# Patient Record
Sex: Female | Born: 1985 | Race: Black or African American | Hispanic: No | Marital: Married | Smoking: Former smoker
Health system: Southern US, Community
[De-identification: ages and names within clinical notes are randomized; demographics above are authoritative.]

## PROBLEM LIST (undated history)

## (undated) ENCOUNTER — Inpatient Hospital Stay (HOSPITAL_COMMUNITY): Payer: Self-pay

## (undated) DIAGNOSIS — Z789 Other specified health status: Secondary | ICD-10-CM

## (undated) HISTORY — PX: CERVICAL POLYPECTOMY: SHX88

## (undated) HISTORY — PX: DILATION AND CURETTAGE OF UTERUS: SHX78

---
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
## 2005-10-26 ENCOUNTER — Other Ambulatory Visit: Admission: RE | Admit: 2005-10-26 | Discharge: 2005-10-26 | Payer: Self-pay | Admitting: *Deleted

## 2006-12-30 ENCOUNTER — Other Ambulatory Visit: Admission: RE | Admit: 2006-12-30 | Discharge: 2006-12-30 | Payer: Self-pay | Admitting: *Deleted

## 2009-01-24 ENCOUNTER — Ambulatory Visit (HOSPITAL_COMMUNITY): Admission: RE | Admit: 2009-01-24 | Discharge: 2009-01-24 | Payer: Self-pay | Admitting: Obstetrics and Gynecology

## 2009-01-24 ENCOUNTER — Encounter (INDEPENDENT_AMBULATORY_CARE_PROVIDER_SITE_OTHER): Payer: Self-pay | Admitting: Obstetrics and Gynecology

## 2009-01-30 ENCOUNTER — Ambulatory Visit (HOSPITAL_COMMUNITY): Admission: AD | Admit: 2009-01-30 | Discharge: 2009-01-30 | Payer: Self-pay | Admitting: Obstetrics and Gynecology

## 2009-01-30 ENCOUNTER — Encounter (INDEPENDENT_AMBULATORY_CARE_PROVIDER_SITE_OTHER): Payer: Self-pay | Admitting: Obstetrics and Gynecology

## 2010-07-17 LAB — RH IMMUNE GLOBULIN WORKUP (NOT WOMEN'S HOSP)
ABO/RH(D): O NEG
Antibody Screen: NEGATIVE

## 2010-07-17 LAB — CBC
MCHC: 33.7 g/dL (ref 30.0–36.0)
MCHC: 34.1 g/dL (ref 30.0–36.0)
MCV: 92.3 fL (ref 78.0–100.0)
Platelets: 185 10*3/uL (ref 150–400)
RBC: 3.89 MIL/uL (ref 3.87–5.11)
RDW: 13.3 % (ref 11.5–15.5)
WBC: 4.6 10*3/uL (ref 4.0–10.5)

## 2011-10-21 LAB — OB RESULTS CONSOLE GC/CHLAMYDIA
Chlamydia: NEGATIVE
Gonorrhea: NEGATIVE

## 2011-10-21 LAB — OB RESULTS CONSOLE RUBELLA ANTIBODY, IGM: Rubella: IMMUNE

## 2011-10-21 LAB — OB RESULTS CONSOLE HEPATITIS B SURFACE ANTIGEN: Hepatitis B Surface Ag: NEGATIVE

## 2011-10-21 LAB — OB RESULTS CONSOLE HIV ANTIBODY (ROUTINE TESTING): HIV: NONREACTIVE

## 2011-10-21 LAB — OB RESULTS CONSOLE RPR: RPR: NONREACTIVE

## 2011-11-17 ENCOUNTER — Other Ambulatory Visit: Payer: Self-pay | Admitting: Obstetrics and Gynecology

## 2012-04-13 NOTE — L&D Delivery Note (Signed)
Delivery Note  SVD viable female Apgars 9,9 over vaginal sulcus tear but intact perineum. Nuchal cord x 1 reduced. Placenta delivered spontaneously intact with 3VC. Repair with 2-0 Chromic with good support and hemostasis noted and R/V exam confirms.  PH art was sent.  [place] cord blood was not done.  Mother and baby were doing well.  EBL 300cc  Candice Camp, MD

## 2012-04-30 ENCOUNTER — Inpatient Hospital Stay (HOSPITAL_COMMUNITY)
Admission: AD | Admit: 2012-04-30 | Discharge: 2012-04-30 | Disposition: A | Discharge status: Home / Self Care | Payer: BC Managed Care – PPO | Source: Ambulatory Visit | Attending: Obstetrics and Gynecology | Admitting: Obstetrics and Gynecology

## 2012-04-30 ENCOUNTER — Encounter (HOSPITAL_COMMUNITY): Payer: Self-pay | Admitting: *Deleted

## 2012-04-30 DIAGNOSIS — N949 Unspecified condition associated with female genital organs and menstrual cycle: Secondary | ICD-10-CM | POA: Insufficient documentation

## 2012-04-30 DIAGNOSIS — O99891 Other specified diseases and conditions complicating pregnancy: Secondary | ICD-10-CM | POA: Insufficient documentation

## 2012-04-30 DIAGNOSIS — R109 Unspecified abdominal pain: Secondary | ICD-10-CM | POA: Insufficient documentation

## 2012-04-30 NOTE — MAU Note (Signed)
PT SAYS SHE HAD  GLOBS OF  MUCUS AT   630PM.    VE IN OFFICE ON Tuesday-  1 CM.   DENIES HSV AND MRSA

## 2012-04-30 NOTE — MAU Note (Signed)
My mucous plug passed. Called on-call # and told to come in and be evaluated. Some cramping

## 2012-05-16 ENCOUNTER — Encounter (HOSPITAL_COMMUNITY): Payer: Self-pay | Admitting: *Deleted

## 2012-05-16 ENCOUNTER — Inpatient Hospital Stay (HOSPITAL_COMMUNITY)
Admission: AD | Admit: 2012-05-16 | Discharge: 2012-05-19 | DRG: 373 | Disposition: A | Discharge status: Home / Self Care | Payer: BC Managed Care – PPO | Source: Ambulatory Visit | Attending: Obstetrics and Gynecology | Admitting: Obstetrics and Gynecology

## 2012-05-16 HISTORY — DX: Other specified health status: Z78.9

## 2012-05-16 NOTE — MAU Note (Signed)
Pt G2P0 at 39wks, leaking clear fluid since 2245.  Having contractions every 5 min.

## 2012-05-17 ENCOUNTER — Encounter (HOSPITAL_COMMUNITY): Payer: Self-pay | Admitting: Anesthesiology

## 2012-05-17 ENCOUNTER — Encounter (HOSPITAL_COMMUNITY): Payer: Self-pay | Admitting: *Deleted

## 2012-05-17 ENCOUNTER — Inpatient Hospital Stay (HOSPITAL_COMMUNITY): Payer: BC Managed Care – PPO | Admitting: Anesthesiology

## 2012-05-17 LAB — CBC
HCT: 36.2 % (ref 36.0–46.0)
MCH: 31 pg (ref 26.0–34.0)
MCV: 92.1 fL (ref 78.0–100.0)
Platelets: 138 10*3/uL — ABNORMAL LOW (ref 150–400)
RBC: 3.93 MIL/uL (ref 3.87–5.11)
WBC: 7 10*3/uL (ref 4.0–10.5)

## 2012-05-17 MED ORDER — SENNOSIDES-DOCUSATE SODIUM 8.6-50 MG PO TABS
2.0000 | ORAL_TABLET | Freq: Every day | ORAL | Status: DC
  Administered 2012-05-18: 2 via ORAL

## 2012-05-17 MED ORDER — MEASLES, MUMPS & RUBELLA VAC ~~LOC~~ INJ
0.5000 mL | INJECTION | Freq: Once | SUBCUTANEOUS | Status: DC
  Filled 2012-05-17: qty 0.5

## 2012-05-17 MED ORDER — IBUPROFEN 600 MG PO TABS
600.0000 mg | ORAL_TABLET | Freq: Four times a day (QID) | ORAL | Status: DC
  Administered 2012-05-18 – 2012-05-19 (×7): 600 mg via ORAL
  Filled 2012-05-17 (×6): qty 1

## 2012-05-17 MED ORDER — DIPHENHYDRAMINE HCL 50 MG/ML IJ SOLN: 12.5000 mg | INTRAMUSCULAR | Status: DC | PRN

## 2012-05-17 MED ORDER — WITCH HAZEL-GLYCERIN EX PADS: 1.0000 "application " | MEDICATED_PAD | CUTANEOUS | Status: DC | PRN

## 2012-05-17 MED ORDER — DIPHENHYDRAMINE HCL 25 MG PO CAPS: 25.0000 mg | ORAL_CAPSULE | Freq: Four times a day (QID) | ORAL | Status: DC | PRN

## 2012-05-17 MED ORDER — PHENYLEPHRINE 40 MCG/ML (10ML) SYRINGE FOR IV PUSH (FOR BLOOD PRESSURE SUPPORT): 80.0000 ug | PREFILLED_SYRINGE | INTRAVENOUS | Status: DC | PRN

## 2012-05-17 MED ORDER — TERBUTALINE SULFATE 1 MG/ML IJ SOLN: 0.2500 mg | Freq: Once | INTRAMUSCULAR | Status: DC | PRN

## 2012-05-17 MED ORDER — SODIUM BICARBONATE 8.4 % IV SOLN
INTRAVENOUS | Status: DC | PRN
  Administered 2012-05-17: 5 mL via EPIDURAL

## 2012-05-17 MED ORDER — FENTANYL 2.5 MCG/ML BUPIVACAINE 1/10 % EPIDURAL INFUSION (WH - ANES)
14.0000 mL/h | INTRAMUSCULAR | Status: DC
Start: 2012-05-17 — End: 2012-05-17
  Administered 2012-05-17 (×2): 14 mL/h via EPIDURAL
  Filled 2012-05-17 (×2): qty 125

## 2012-05-17 MED ORDER — SIMETHICONE 80 MG PO CHEW: 80.0000 mg | CHEWABLE_TABLET | ORAL | Status: DC | PRN

## 2012-05-17 MED ORDER — CITRIC ACID-SODIUM CITRATE 334-500 MG/5ML PO SOLN: 30.0000 mL | ORAL | Status: DC | PRN

## 2012-05-17 MED ORDER — BUTORPHANOL TARTRATE 1 MG/ML IJ SOLN
1.0000 mg | INTRAMUSCULAR | Status: DC | PRN
  Administered 2012-05-17 (×3): 1 mg via INTRAVENOUS
  Filled 2012-05-17 (×3): qty 1

## 2012-05-17 MED ORDER — SODIUM CHLORIDE 0.9 % IV SOLN
2.0000 g | Freq: Four times a day (QID) | INTRAVENOUS | Status: DC
  Administered 2012-05-17: 2 g via INTRAVENOUS
  Filled 2012-05-17 (×2): qty 2000

## 2012-05-17 MED ORDER — EPHEDRINE 5 MG/ML INJ
10.0000 mg | INTRAVENOUS | Status: DC | PRN
  Filled 2012-05-17: qty 4

## 2012-05-17 MED ORDER — IBUPROFEN 600 MG PO TABS: 600.0000 mg | ORAL_TABLET | Freq: Four times a day (QID) | ORAL | Status: DC | PRN

## 2012-05-17 MED ORDER — LACTATED RINGERS IV SOLN
500.0000 mL | Freq: Once | INTRAVENOUS | Status: AC
  Administered 2012-05-17: 500 mL via INTRAVENOUS

## 2012-05-17 MED ORDER — OXYCODONE-ACETAMINOPHEN 5-325 MG PO TABS
1.0000 | ORAL_TABLET | ORAL | Status: DC | PRN
  Administered 2012-05-19: 1 via ORAL
  Filled 2012-05-17: qty 1

## 2012-05-17 MED ORDER — MEDROXYPROGESTERONE ACETATE 150 MG/ML IM SUSP: 150.0000 mg | INTRAMUSCULAR | Status: DC | PRN

## 2012-05-17 MED ORDER — ZOLPIDEM TARTRATE 5 MG PO TABS: 5.0000 mg | ORAL_TABLET | Freq: Every evening | ORAL | Status: DC | PRN

## 2012-05-17 MED ORDER — LIDOCAINE HCL (PF) 1 % IJ SOLN
30.0000 mL | INTRAMUSCULAR | Status: DC | PRN
  Filled 2012-05-17: qty 30

## 2012-05-17 MED ORDER — DIBUCAINE 1 % RE OINT: 1.0000 "application " | TOPICAL_OINTMENT | RECTAL | Status: DC | PRN

## 2012-05-17 MED ORDER — EPHEDRINE 5 MG/ML INJ: 10.0000 mg | INTRAVENOUS | Status: DC | PRN

## 2012-05-17 MED ORDER — ACETAMINOPHEN 325 MG PO TABS: 650.0000 mg | ORAL_TABLET | ORAL | Status: DC | PRN

## 2012-05-17 MED ORDER — TETANUS-DIPHTH-ACELL PERTUSSIS 5-2.5-18.5 LF-MCG/0.5 IM SUSP: 0.5000 mL | Freq: Once | INTRAMUSCULAR | Status: DC

## 2012-05-17 MED ORDER — OXYTOCIN 40 UNITS IN LACTATED RINGERS INFUSION - SIMPLE MED
1.0000 m[IU]/min | INTRAVENOUS | Status: DC
  Administered 2012-05-17: 2 m[IU]/min via INTRAVENOUS
  Filled 2012-05-17: qty 1000

## 2012-05-17 MED ORDER — PRENATAL MULTIVITAMIN CH
1.0000 | ORAL_TABLET | Freq: Every day | ORAL | Status: DC
  Administered 2012-05-18 – 2012-05-19 (×2): 1 via ORAL
  Filled 2012-05-17 (×2): qty 1

## 2012-05-17 MED ORDER — OXYTOCIN 40 UNITS IN LACTATED RINGERS INFUSION - SIMPLE MED
62.5000 mL/h | INTRAVENOUS | Status: DC
  Administered 2012-05-17: 62.5 mL/h via INTRAVENOUS

## 2012-05-17 MED ORDER — PHENYLEPHRINE 40 MCG/ML (10ML) SYRINGE FOR IV PUSH (FOR BLOOD PRESSURE SUPPORT)
80.0000 ug | PREFILLED_SYRINGE | INTRAVENOUS | Status: DC | PRN
  Filled 2012-05-17: qty 5

## 2012-05-17 MED ORDER — OXYTOCIN BOLUS FROM INFUSION: 500.0000 mL | INTRAVENOUS | Status: DC

## 2012-05-17 MED ORDER — LANOLIN HYDROUS EX OINT: TOPICAL_OINTMENT | CUTANEOUS | Status: DC | PRN

## 2012-05-17 MED ORDER — LACTATED RINGERS IV SOLN
INTRAVENOUS | Status: DC
  Administered 2012-05-17 (×4): via INTRAVENOUS

## 2012-05-17 MED ORDER — BENZOCAINE-MENTHOL 20-0.5 % EX AERO
1.0000 "application " | INHALATION_SPRAY | CUTANEOUS | Status: DC | PRN
  Administered 2012-05-18: 1 via TOPICAL
  Filled 2012-05-17: qty 56

## 2012-05-17 MED ORDER — OXYCODONE-ACETAMINOPHEN 5-325 MG PO TABS: 1.0000 | ORAL_TABLET | ORAL | Status: DC | PRN

## 2012-05-17 MED ORDER — LACTATED RINGERS IV SOLN: 500.0000 mL | INTRAVENOUS | Status: DC | PRN

## 2012-05-17 MED ORDER — ONDANSETRON HCL 4 MG/2ML IJ SOLN: 4.0000 mg | INTRAMUSCULAR | Status: DC | PRN

## 2012-05-17 MED ORDER — FLEET ENEMA 7-19 GM/118ML RE ENEM: 1.0000 | ENEMA | RECTAL | Status: DC | PRN

## 2012-05-17 MED ORDER — ONDANSETRON HCL 4 MG/2ML IJ SOLN: 4.0000 mg | Freq: Four times a day (QID) | INTRAMUSCULAR | Status: DC | PRN

## 2012-05-17 MED ORDER — ONDANSETRON HCL 4 MG PO TABS: 4.0000 mg | ORAL_TABLET | ORAL | Status: DC | PRN

## 2012-05-17 NOTE — H&P (Signed)
Anita Lyons is a 27 y.o. female presenting for SROM clear fluid at 1045 last night.  Pregnancy has been uncomplicated. GBS-. History OB History    Grav Para Term Preterm Abortions TAB SAB Ect Mult Living   2    1  1    0     Past Medical History  Diagnosis Date  . No pertinent past medical history    Past Surgical History  Procedure Date  . Dilation and curettage of uterus   . Cervical polypectomy    Family History: family history is negative for Other. Social History:  reports that she has quit smoking. She does not have any smokeless tobacco history on file. She reports that she does not drink alcohol or use illicit drugs.   Prenatal Transfer Tool  Maternal Diabetes: No Genetic Screening: Normal Maternal Ultrasounds/Referrals: Normal Fetal Ultrasounds or other Referrals:  None Maternal Substance Abuse:  No Significant Maternal Medications:  None Significant Maternal Lab Results:  None Other Comments:  None  ROS  Dilation: 3.5 Effacement (%): 70 Station: -2 Exam by:: R Simpson RN Blood pressure 110/90, pulse 78, temperature 98.2 F (36.8 C), temperature source Oral, resp. rate 16, height 5' 4.6" (1.641 m), weight 89.994 kg (198 lb 6.4 oz). Exam Physical Exam  Prenatal labs: ABO, Rh:   Antibody:   Rubella: Immune (07/10 0000) RPR: Nonreactive (07/10 0000)  HBsAg: Negative (07/10 0000)  HIV: Non-reactive (07/10 0000)  GBS:     Assessment/Plan: IUP at term with SROM in early labor Pitocin augmentation. Anticipate SVD   Shira Bobst C 05/17/2012, 8:34 AM

## 2012-05-17 NOTE — Progress Notes (Signed)
Pt comfortable with epidural VSSAF FHR 140s with accels Ctxs q 2-4 Cx per rn 6-5/90/-2  Plan IV Amp for prolonged ROM Continue with pitocin augmentation DL

## 2012-05-17 NOTE — Anesthesia Preprocedure Evaluation (Signed)

## 2012-05-17 NOTE — Anesthesia Procedure Notes (Signed)

## 2012-05-18 LAB — CBC
MCH: 30.3 pg (ref 26.0–34.0)
MCHC: 32.6 g/dL (ref 30.0–36.0)
RDW: 13.6 % (ref 11.5–15.5)

## 2012-05-18 MED ORDER — RHO D IMMUNE GLOBULIN 1500 UNIT/2ML IJ SOLN
300.0000 ug | Freq: Once | INTRAMUSCULAR | Status: AC
  Administered 2012-05-18: 300 ug via INTRAMUSCULAR
  Filled 2012-05-18: qty 2

## 2012-05-18 NOTE — Anesthesia Postprocedure Evaluation (Signed)
  Anesthesia Post-op Note  Patient: Anita Lyons  Procedure(s) Performed: * No procedures listed *  Patient Location: Mother/Baby  Anesthesia Type:Epidural  Level of Consciousness: awake, alert  and oriented  Airway and Oxygen Therapy: Patient Spontanous Breathing  Post-op Pain: mild  Post-op Assessment: Patient's Cardiovascular Status Stable, Respiratory Function Stable, No headache, No backache, No residual numbness and No residual motor weakness  Post-op Vital Signs: stable  Complications: No apparent anesthesia complications

## 2012-05-18 NOTE — Progress Notes (Signed)
Post Partum Day 1 Subjective: no complaints, up ad lib, voiding and tolerating PO  Objective: Blood pressure 117/81, pulse 77, temperature 98.1 F (36.7 C), temperature source Oral, resp. rate 18, height 5' 4.6" (1.641 m), weight 198 lb 6.4 oz (89.994 kg), SpO2 96.00%, unknown if currently breastfeeding.  Physical Exam:  General: alert and cooperative Lochia: appropriate Uterine Fundus: firm Incision: perineum intact DVT Evaluation: No evidence of DVT seen on physical exam. No significant calf/ankle edema.   Basename 05/18/12 0525 05/17/12 0056  HGB 10.8* 12.2  HCT 33.1* 36.2    Assessment/Plan: Plan for discharge tomorrow   LOS: 2 days   Erial Fikes G 05/18/2012, 8:05 AM

## 2012-05-19 LAB — RH IG WORKUP (INCLUDES ABO/RH)
ABO/RH(D): O NEG
Antibody Screen: POSITIVE
Gestational Age(Wks): 39
Unit division: 0

## 2012-05-19 MED ORDER — OXYCODONE-ACETAMINOPHEN 5-325 MG PO TABS: 1.0000 | ORAL_TABLET | ORAL | Status: DC | PRN

## 2012-05-19 MED ORDER — IBUPROFEN 600 MG PO TABS: 600.0000 mg | ORAL_TABLET | Freq: Four times a day (QID) | ORAL | Status: DC

## 2012-05-19 NOTE — Discharge Summary (Signed)
Obstetric Discharge Summary Reason for Admission: rupture of membranes Prenatal Procedures: ultrasound Intrapartum Procedures: spontaneous vaginal delivery Postpartum Procedures: none Complications-Operative and Postpartum: none Hemoglobin  Date Value Range Status  05/18/2012 10.8* 12.0 - 15.0 g/dL Final     HCT  Date Value Range Status  05/18/2012 33.1* 36.0 - 46.0 % Final    Physical Exam:  General: alert and cooperative Lochia: appropriate Uterine Fundus: firm Incision: perineum intact DVT Evaluation: No evidence of DVT seen on physical exam. No significant calf/ankle edema.  Discharge Diagnoses: Term Pregnancy-delivered  Discharge Information: Date: 05/19/2012 Activity: pelvic rest Diet: routine Medications: PNV, Ibuprofen and Percocet Condition: stable Instructions: refer to practice specific booklet Discharge to: home   Newborn Data: Live born female  Birth Weight: 7 lb 12 oz (3515 g) APGAR: 9, 9  Home with mother.  Anita Lyons G 05/19/2012, 8:06 AM

## 2012-05-20 ENCOUNTER — Encounter (HOSPITAL_COMMUNITY)
Admission: RE | Admit: 2012-05-20 | Discharge: 2012-05-20 | Disposition: A | Discharge status: Home / Self Care | Payer: BC Managed Care – PPO | Source: Ambulatory Visit | Attending: Obstetrics and Gynecology | Admitting: Obstetrics and Gynecology

## 2012-05-20 DIAGNOSIS — O923 Agalactia: Secondary | ICD-10-CM | POA: Insufficient documentation

## 2012-05-27 ENCOUNTER — Ambulatory Visit (HOSPITAL_COMMUNITY): Admission: RE | Admit: 2012-05-27 | Payer: BC Managed Care – PPO | Source: Ambulatory Visit

## 2012-06-01 ENCOUNTER — Ambulatory Visit (HOSPITAL_COMMUNITY)
Admission: RE | Admit: 2012-06-01 | Discharge: 2012-06-01 | Disposition: A | Discharge status: Home / Self Care | Payer: BC Managed Care – PPO | Source: Ambulatory Visit | Attending: Obstetrics and Gynecology | Admitting: Obstetrics and Gynecology

## 2012-06-01 NOTE — Lactation Note (Signed)
Adult Lactation Consultation Outpatient Visit Note  Patient Name: Anita Lyons                            Baby Girl Rayvon Char, DOB [birth year], Birth Weight 7 lb. 71 oz, now 34 weeks old Date of Birth: 07-12-1985 Gestational Age at Delivery: Unknown Type of Delivery: SVB  Breastfeeding History: Frequency of Breastfeeding: every 3 hours using nipple shield and SNS. This morning Mom was able to latch baby without the nipple shield for 10 minutes. Length of Feeding: 15-30 minutes using nipple shield and SNS Voids: 4-5 Stools: 1-2/day   Green, loose  Supplementing / Method: Pumping:  Type of Pump:  Symphony   Frequency:  After each feeding during the day, every 3 hours, 15 minutes  Volume:  45 ml from both breasts, has received 60 ml 2 times recently  Comments: Mom is here for feeding assessment. She has been using the nipple shield and SNS to breast feed. She reports the baby gets frustrated at the breast when trying to latch without the nipple shield/SNS. She breast feeds using the nipple shield/SNS on 1 breast each feeding, then alternates breast for next feeding. Supplements in the evening and at night with bottle. Supplements are with formula or EBM whichever is available to make a feeding.  Mom reports went for follow up with Peds on Monday, 05/30/12 and was told to pre-pump or give appetizer with bottle then attempt to latch. Mom was able to get baby latched this am without the nipple shield. She reports the baby nursed for 10 minutes then fell asleep. Baby is now 15 weeks old and not back to her birth weight. She has gained 3.5 oz in the past 2-3 days, since Monday, 05/30/12. Peds instructed Mom to supplement with 45 ml of EBM or formula.   Consultation Evaluation: On exam, Mom's breasts are large, soft, nipples are erect with short nipple shaft. Had Mom massage her breast and hand express some breast milk. Baby latched without the nipple shield after a few attempts on each breast for the  feedings at this visit.  Baby demonstrated a good rhythmic suck with some swallows. He nursed for 17 minutes on the left breast and 22 minutes on the right. He becomes very sleepy at the breast after about 10 minutes and begins to demonstrate intermittent non-nutritive suckling. Had Mom post pump with DEBP, she received 8 ml of EBM.  Initial Feeding Assessment: Pre-feed Weight:  7 lb. 10.5 oz/3472 gm Post-feed Weight:  7 lb. 11.1 oz/3490 gm Amount Transferred:  18 ml Comments: breast feeding for 17 minutes on the left breast  Additional Feeding Assessment: Pre-feed Weight:   7 lb. 11.1 oz/3490 gm Post-feed Weight:  7 lb. 11.6 oz/3504 gm Amount Transferred:   14 ml. Comments:  Breast feeding for 22 minutes on right breast  Additional Feeding Assessment: Pre-feed Weight: Post-feed Weight: Amount Transferred: Comments:  Total Breast milk Transferred this Visit:  32 ml Total Supplement Given:  50 ml of Similac Advance  Additional Interventions: Encouraged Mom to keep latching baby at the breast for each feeding without the nipple shield. Pre-pump to assist with latch if baby is fussy. Advised if baby is fussy and will not latch, give an appetizer with the bottle and then attempt to latch in an effort to work baby to the breast without the nipple shield and SNS. Keep baby active at the breast for 15-20 minutes each  breast, each feeding. Mom needs to continue to supplement till baby transfers milk better at the breast. If she chooses to use the SNS then advised to breast feed on the 1st breast without the SNS, use the SNS on the 2nd breast. May use bottle to supplement. Supplement with 45 ml of EBM or formula Post pump every 3 hours for 15-20 minutes. Information given on Fenugreek supplements to start to encourage milk production.   Follow-Up  Lactation, Wednesday, 06/08/12 at 1:00pm    Alfred Levins 06/01/2012, 2:51 PM

## 2012-06-08 ENCOUNTER — Ambulatory Visit (HOSPITAL_COMMUNITY): Payer: BC Managed Care – PPO

## 2012-06-18 ENCOUNTER — Encounter (HOSPITAL_COMMUNITY)
Admission: RE | Admit: 2012-06-18 | Discharge: 2012-06-18 | Disposition: A | Discharge status: Home / Self Care | Payer: BC Managed Care – PPO | Source: Ambulatory Visit | Attending: Obstetrics and Gynecology | Admitting: Obstetrics and Gynecology

## 2012-06-18 DIAGNOSIS — O923 Agalactia: Secondary | ICD-10-CM | POA: Insufficient documentation

## 2012-07-19 ENCOUNTER — Encounter (HOSPITAL_COMMUNITY)
Admission: RE | Admit: 2012-07-19 | Discharge: 2012-07-19 | Disposition: A | Discharge status: Home / Self Care | Payer: BC Managed Care – PPO | Source: Ambulatory Visit | Attending: Obstetrics and Gynecology | Admitting: Obstetrics and Gynecology

## 2012-07-19 DIAGNOSIS — O923 Agalactia: Secondary | ICD-10-CM | POA: Insufficient documentation

## 2012-08-19 ENCOUNTER — Encounter (HOSPITAL_COMMUNITY)
Admission: RE | Admit: 2012-08-19 | Discharge: 2012-08-19 | Disposition: A | Discharge status: Home / Self Care | Payer: BC Managed Care – PPO | Source: Ambulatory Visit | Attending: Obstetrics and Gynecology | Admitting: Obstetrics and Gynecology

## 2012-08-19 DIAGNOSIS — O923 Agalactia: Secondary | ICD-10-CM | POA: Insufficient documentation

## 2012-09-19 ENCOUNTER — Encounter (HOSPITAL_COMMUNITY)
Admission: RE | Admit: 2012-09-19 | Discharge: 2012-09-19 | Disposition: A | Discharge status: Home / Self Care | Payer: BC Managed Care – PPO | Source: Ambulatory Visit | Attending: Obstetrics and Gynecology | Admitting: Obstetrics and Gynecology

## 2012-09-19 DIAGNOSIS — O923 Agalactia: Secondary | ICD-10-CM | POA: Insufficient documentation

## 2012-10-19 ENCOUNTER — Encounter (HOSPITAL_COMMUNITY)
Admission: RE | Admit: 2012-10-19 | Discharge: 2012-10-19 | Disposition: A | Discharge status: Home / Self Care | Payer: BC Managed Care – PPO | Source: Ambulatory Visit | Attending: Obstetrics and Gynecology | Admitting: Obstetrics and Gynecology

## 2012-10-19 DIAGNOSIS — O923 Agalactia: Secondary | ICD-10-CM | POA: Insufficient documentation

## 2012-11-19 ENCOUNTER — Encounter (HOSPITAL_COMMUNITY)
Admission: RE | Admit: 2012-11-19 | Discharge: 2012-11-19 | Disposition: A | Discharge status: Home / Self Care | Payer: BC Managed Care – PPO | Source: Ambulatory Visit | Attending: Obstetrics and Gynecology | Admitting: Obstetrics and Gynecology

## 2012-11-19 DIAGNOSIS — O923 Agalactia: Secondary | ICD-10-CM | POA: Insufficient documentation

## 2012-12-20 ENCOUNTER — Encounter (HOSPITAL_COMMUNITY)
Admission: RE | Admit: 2012-12-20 | Discharge: 2012-12-20 | Disposition: A | Discharge status: Home / Self Care | Payer: BC Managed Care – PPO | Source: Ambulatory Visit | Attending: Obstetrics and Gynecology | Admitting: Obstetrics and Gynecology

## 2012-12-20 DIAGNOSIS — O923 Agalactia: Secondary | ICD-10-CM | POA: Insufficient documentation

## 2012-12-30 ENCOUNTER — Telehealth: Payer: Self-pay | Admitting: Internal Medicine

## 2012-12-30 NOTE — Telephone Encounter (Signed)
Pt states that her husband Theotis Burrow is currently a pt of Dr. Kirtland Bouchard, and that she would like to establish with you as well. She has BCBS as her insurance. Would you be willing to accept her as a pt?

## 2013-01-02 NOTE — Telephone Encounter (Signed)
Left voicemail for pt to call back.

## 2013-01-02 NOTE — Telephone Encounter (Signed)
Pt returned call. Forwarded message to pt. Offered another provider. Pt will call back.

## 2013-01-02 NOTE — Telephone Encounter (Signed)
Suggest another provider? °

## 2013-01-20 ENCOUNTER — Encounter (HOSPITAL_COMMUNITY)
Admission: RE | Admit: 2013-01-20 | Discharge: 2013-01-20 | Disposition: A | Discharge status: Home / Self Care | Payer: BC Managed Care – PPO | Source: Ambulatory Visit | Attending: Obstetrics and Gynecology | Admitting: Obstetrics and Gynecology

## 2013-01-20 DIAGNOSIS — O923 Agalactia: Secondary | ICD-10-CM | POA: Insufficient documentation

## 2013-02-20 ENCOUNTER — Encounter (HOSPITAL_COMMUNITY)
Admission: RE | Admit: 2013-02-20 | Discharge: 2013-02-20 | Disposition: A | Discharge status: Home / Self Care | Payer: BC Managed Care – PPO | Source: Ambulatory Visit | Attending: Obstetrics and Gynecology | Admitting: Obstetrics and Gynecology

## 2013-02-20 DIAGNOSIS — O923 Agalactia: Secondary | ICD-10-CM | POA: Insufficient documentation

## 2013-03-23 ENCOUNTER — Encounter (HOSPITAL_COMMUNITY)
Admission: RE | Admit: 2013-03-23 | Discharge: 2013-03-23 | Disposition: A | Discharge status: Home / Self Care | Payer: BC Managed Care – PPO | Source: Ambulatory Visit | Attending: Obstetrics and Gynecology | Admitting: Obstetrics and Gynecology

## 2013-03-23 DIAGNOSIS — O923 Agalactia: Secondary | ICD-10-CM | POA: Insufficient documentation

## 2013-04-23 ENCOUNTER — Encounter (HOSPITAL_COMMUNITY)
Admission: RE | Admit: 2013-04-23 | Discharge: 2013-04-23 | Disposition: A | Discharge status: Home / Self Care | Payer: BC Managed Care – PPO | Source: Ambulatory Visit | Attending: Obstetrics and Gynecology | Admitting: Obstetrics and Gynecology

## 2013-04-23 DIAGNOSIS — O923 Agalactia: Secondary | ICD-10-CM | POA: Insufficient documentation

## 2013-05-24 ENCOUNTER — Encounter (HOSPITAL_COMMUNITY)
Admission: RE | Admit: 2013-05-24 | Discharge: 2013-05-24 | Disposition: A | Discharge status: Home / Self Care | Payer: BC Managed Care – PPO | Source: Ambulatory Visit | Attending: Obstetrics and Gynecology | Admitting: Obstetrics and Gynecology

## 2013-05-24 DIAGNOSIS — O923 Agalactia: Secondary | ICD-10-CM | POA: Insufficient documentation

## 2013-06-23 ENCOUNTER — Encounter (HOSPITAL_COMMUNITY)
Admission: RE | Admit: 2013-06-23 | Discharge: 2013-06-23 | Disposition: A | Discharge status: Home / Self Care | Payer: BC Managed Care – PPO | Source: Ambulatory Visit | Attending: Obstetrics and Gynecology | Admitting: Obstetrics and Gynecology

## 2013-06-23 DIAGNOSIS — O923 Agalactia: Secondary | ICD-10-CM | POA: Insufficient documentation

## 2014-02-12 ENCOUNTER — Encounter (HOSPITAL_COMMUNITY): Payer: Self-pay | Admitting: *Deleted

## 2017-09-30 ENCOUNTER — Other Ambulatory Visit: Payer: Self-pay | Admitting: Family Medicine

## 2017-09-30 DIAGNOSIS — R1032 Left lower quadrant pain: Secondary | ICD-10-CM

## 2017-10-13 ENCOUNTER — Ambulatory Visit
Admission: RE | Admit: 2017-10-13 | Discharge: 2017-10-13 | Disposition: A | Discharge status: Home / Self Care | Payer: BC Managed Care – PPO | Source: Ambulatory Visit | Attending: Family Medicine | Admitting: Family Medicine

## 2017-10-13 DIAGNOSIS — R1032 Left lower quadrant pain: Secondary | ICD-10-CM

## 2017-11-11 ENCOUNTER — Other Ambulatory Visit: Payer: Self-pay

## 2017-11-11 ENCOUNTER — Ambulatory Visit: Payer: BC Managed Care – PPO | Attending: Family Medicine | Admitting: Physical Therapy

## 2017-11-11 ENCOUNTER — Encounter: Payer: Self-pay | Admitting: Physical Therapy

## 2017-11-11 DIAGNOSIS — M5442 Lumbago with sciatica, left side: Secondary | ICD-10-CM | POA: Insufficient documentation

## 2017-11-11 DIAGNOSIS — M6281 Muscle weakness (generalized): Secondary | ICD-10-CM

## 2017-11-11 DIAGNOSIS — G8929 Other chronic pain: Secondary | ICD-10-CM | POA: Insufficient documentation

## 2017-11-11 NOTE — Patient Instructions (Signed)
       Access Code: [email or web address]  URL: [email or web address]  Date: 11/11/2017  Prepared by: Lavinia SharpsStacy Makarios Madlock   Exercises  Prone Press Up - 10 reps - 1 sets - 1x daily - 7x weekly  Hooklying Transversus Abdominis Palpation - 10 reps - 1 sets - 5 hold - 1x daily - 7x weekly     Lavinia SharpsStacy Jaclyn Andy PT [facility] [address], [address] [place], [place] [address] Phone # [phone] Fax [phone]

## 2017-11-11 NOTE — Therapy (Signed)
[facility] [address], [address] [place], [place], [address] Phone: [phone]   Fax:  [phone]  Physical Therapy Evaluation  Patient Details  Name: Anita Lyons MRN: [id number] Date of Birth: August 02, 1985 Referring Provider: Dr. Duanne Guessewey   Encounter Date: 11/11/2017  PT End of Session - 11/11/17 2016    Visit Number  1    Date for PT Re-Evaluation  01/06/18    PT Start Time  0930    PT Stop Time  1015    PT Time Calculation (min)  45 min    Activity Tolerance  Patient tolerated treatment well       Past Medical History:  Diagnosis Date  . No pertinent past medical history     Past Surgical History:  Procedure Laterality Date  . CERVICAL POLYPECTOMY    . DILATION AND CURETTAGE OF UTERUS      There were no vitals filed for this visit.   Subjective Assessment - 11/11/17 0934    Subjective  1 year history of low back pain only on left above iliac crest.  Got monthly massages which helped initially.  In March, the pain worsened.   Stretches would not alleviate.  Limp at the end of the day.  Stopped running and exercising.  [facility] in [facility] hospital.      Pertinent History  left knee fluid     Limitations  House hold activities;Walking;Sitting    How long can you sit comfortably?  < 30 minutes    How long can you walk comfortably?  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  Yes    Pain Score  4     Pain Location  Back    Pain Orientation  Left;Lower    Pain Type  Chronic pain    Pain Onset  More than a month ago    Pain Frequency  Constant    Aggravating Factors   walk and plant the foot down,  adduct leg, as the day goes on    Pain Relieving Factors  mornings         [facility] - 11/11/17 0001      Assessment   Medical Diagnosis   left sciatica    Referring Provider  Dr. Duanne Guessewey    Onset Date/Surgical Date  -- March    Next MD Visit  as needed    Prior Therapy  no massage only      Precautions   Precautions  None      Restrictions   Weight Bearing Restrictions  No      Balance Screen   Has the patient fallen in the past 6 months  No    Has the patient had a decrease in activity level because of a fear of falling?   No    Is the patient reluctant to leave their home because of a fear of falling?   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    Leisure  travel; read; used to run until 1 month ago       Observation/Other Assessments   Focus on Therapeutic Outcomes (FOTO)   53% limitation       Posture/Postural Control   Posture Comments  normal lordosis      AROM   Lumbar Flexion  90    Lumbar Extension  30    Lumbar - Right Side Bend  40    Lumbar - Left Side Bend  45      Strength   Right Hip Flexion  5/5    Right Hip Extension  4+/5    Right Hip ABduction  4+/5    Left Hip Flexion  5/5    Left Hip Extension  4/5    Left Hip ABduction  4-/5    Lumbar Flexion  4/5    Lumbar Extension  4/5      Slump test   Findings  Negative      Straight Leg Raise   Findings  Positive    Side   Left    Comment  70 degrees                Objective measurements completed on examination: See above findings.              PT Education - 11/11/17 2016    Education Details  press ups, transverse ab activation;  dry needling info    Person(s) Educated  Patient    Methods  Explanation;Demonstration;Handout    Comprehension  Returned demonstration;Verbalized understanding        PT Short Term Goals - 11/11/17 2026      PT SHORT TERM GOAL #1   Title  The patient will demonstrate basic self care strategies including use of lumbar roll when sitting, frequent change of position, basic ex    Time  4    Period  Weeks    Status  New    Target Date  12/09/17      PT SHORT TERM GOAL #2   Title  The patient will report a 30% improvement in back pain with home and work ADLs    Time  4    Period  Weeks    Status  New      PT SHORT TERM GOAL #3   Title  The patient will have improved core strength and pain reduction needed to walk 20-25 min with minimal pain     Time  4    Period  Weeks    Status  New        PT Long Term Goals - 11/11/17 2029      PT LONG TERM GOAL #1   Title  The patient will be independent in safe self progression of HEP    Time  8    Period  Weeks    Status  New    Target Date  01/06/18      PT LONG TERM GOAL #2   Title  The patient will report a 60% reduction in back pain with work and home ADLs    Time  8    Period  Weeks    Status  New      PT LONG TERM GOAL #3   Title  The patient will  be able to walk at least 30 minutes comfortably  needed for work duties     Time  8    Period  Weeks    Status  New      PT LONG TERM GOAL #4   Title  Left hip abductor strength 4/5 and trunk flexor/extensor strength at least 4+/5 needed for standing and walking longer periods of time    Time  8    Period  Weeks    Status  New      PT LONG TERM GOAL #5   Title  FOTO functional outcome score improved from 53% limitation to 35% indicating improved function with less pain     Time  8    Period  Weeks    Status  New             Plan - 11/11/17 1012    Clinical Impression Statement  The patient reports a 1 year history of left lower back pain which began for no apparent reason.  It initially improved with massage but then in March it worsened.  Her pain is worsened with walking > 10-15 min and as the day goes on.  Her lumbar ROM is  WFLs however painful in all directions.  Decreased hip/lumbo/pelvic core strength especially with left hip abduction.  Decreased activation of transversus abdominus and lumbar multifidi.  Tender points in left gluteals and paraspinals.  She is limited in sitting time to < 30 min.  She would benefit from PT to address these deficits.      History and Personal Factors relevant to plan of care:  no co-morbidities;  good previous activity level     Clinical Presentation  Stable    Clinical Decision Making  Low    Rehab Potential  Good    Clinical Impairments Affecting Rehab Potential  none    PT Frequency  2x / week    PT Duration  8 weeks    PT Treatment/Interventions  ADLs/Self Care Home Management;Cryotherapy;Electrical Stimulation;Ultrasound;Traction;Moist Heat;Therapeutic exercise;Therapeutic activities;Patient/family education;Manual techniques;Dry needling;Taping    PT Next Visit Plan  assess response to press ups and abdom brace;  possible DN;   Es/heat as needed;  core strength progression     PT Home Exercise Plan   Access Code: [facility]        Patient will benefit from skilled therapeutic intervention in order to improve the following deficits and impairments:  Pain, Decreased range of motion, Decreased strength, Increased fascial restricitons, Difficulty walking, Impaired perceived functional ability  Visit Diagnosis: Chronic left-sided low back pain with left-sided sciatica - Plan: PT plan of care cert/re-cert  Muscle weakness (generalized) - Plan: PT plan of care cert/re-cert     Problem List There are no active problems to display for this patient.  Lavinia Sharps, PT 11/11/17 8:35 PM Phone: [phone] Fax: [phone]  Vivien Presto 11/11/2017, 8:35 PM  [facility] [address] [place], [place], [address] Phone: [phone]   Fax:  [phone]  Name: Anita Lyons MRN: [id number] Date of  Birth: February 02, 1986

## 2017-11-15 ENCOUNTER — Encounter: Payer: Self-pay | Admitting: Physical Therapy

## 2017-11-15 ENCOUNTER — Ambulatory Visit: Payer: BC Managed Care – PPO | Admitting: Physical Therapy

## 2017-11-15 DIAGNOSIS — M5442 Lumbago with sciatica, left side: Secondary | ICD-10-CM | POA: Diagnosis not present

## 2017-11-15 DIAGNOSIS — G8929 Other chronic pain: Secondary | ICD-10-CM

## 2017-11-15 DIAGNOSIS — M6281 Muscle weakness (generalized): Secondary | ICD-10-CM

## 2017-11-15 NOTE — Therapy (Signed)
[facility] [address], [address] [place], [place], [address] Phone: [phone]   Fax:  [phone]  Physical Therapy Treatment  Patient Details  Name: Anita Lyons MRN: [id number] Date of Birth: 08-07-1985 Referring Provider: Dr. Duanne Guessewey   Encounter Date: 11/15/2017  PT End of Session - 11/15/17 1414    Visit Number  2    Date for PT Re-Evaluation  01/06/18    PT Start Time  1400    PT Stop Time  1442    PT Time Calculation (min)  42 min    Activity Tolerance  Patient tolerated treatment well       Past Medical History:  Diagnosis Date  . No pertinent past medical history     Past Surgical History:  Procedure Laterality Date  . CERVICAL POLYPECTOMY    . DILATION AND CURETTAGE OF UTERUS      There were no vitals filed for this visit.  Subjective Assessment - 11/15/17 1403    Subjective  Pt reports that when she does her pressups in the evening she will wake up with less pain. Pain is still the same spot at the moment.     Pertinent History  left knee fluid     Limitations  House hold activities;Walking;Sitting    How long can you sit comfortably?  < 30 minutes    How long can you walk comfortably?  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  Yes    Pain Score  6     Pain Location  Back    Pain Orientation  Left;Lower    Pain Descriptors / Indicators  Aching;Dull    Pain Type  Chronic pain    Pain Radiating Towards  none     Pain Onset  More than a month ago    Pain Frequency  Constant    Aggravating Factors   planting her foot while walking, worse as the day goes on.     Pain Relieving Factors  mornings are better                       [facility]/Exercise - 11/15/17 0001      Exercises   Exercises  Knee/Hip      Knee/Hip Exercises:  Stretches   Other Knee/Hip Stretches  Lt single knee to chest stretch 5x10 sec; LLE low trunk rotation stretch 3x20 sec    Other Knee/Hip Stretches  double knee to chest stretch 5x10 sec hold       Knee/Hip Exercises: Supine   Other Supine Knee/Hip Exercises  clamshell with green TB x10 reps Lt only, deep abdominal activation      Knee/Hip Exercises: Prone   Other Prone Exercises  prone pressup with "sag" x10 reps       Manual Therapy   Manual Therapy  Joint mobilization;Soft tissue mobilization    Joint Mobilization  Anterior sacral shear with Lt ilium posterior hold during active hip extension x10 reps     Soft tissue mobilization  STM lumbar paraspinals and Lt glute max; trigger point release Lt glute max       Trigger Point Dry Needling - 11/15/17 1430    Consent Given?  Yes    Education  Handout Provided  Yes    Muscles Treated Lower Body  -- (+) Lt lumbar multifidi L2 to L5; Lt paraspinals           PT Education - 11/15/17 1437    Education Details  technique with therex    Person(s) Educated  Patient    Methods  Explanation;Verbal cues;Handout    Comprehension  Verbalized understanding;Returned demonstration       PT Short Term Goals - 11/11/17 2026      PT SHORT TERM GOAL #1   Title  The patient will demonstrate basic self care strategies including use of lumbar roll when sitting, frequent change of position, basic ex    Time  4    Period  Weeks    Status  New    Target Date  12/09/17      PT SHORT TERM GOAL #2   Title  The patient will report a 30% improvement in back pain with home and work ADLs    Time  4    Period  Weeks    Status  New      PT SHORT TERM GOAL #3   Title  The patient will have improved core strength and pain reduction needed to walk 20-25 min with minimal pain     Time  4    Period  Weeks    Status  New        PT Long Term Goals - 11/11/17 2029      PT LONG TERM GOAL #1   Title  The patient will be independent in safe self  progression of HEP    Time  8    Period  Weeks    Status  New    Target Date  01/06/18      PT LONG TERM GOAL #2   Title  The patient will report a 60% reduction in back pain with work and home ADLs    Time  8    Period  Weeks    Status  New      PT LONG TERM GOAL #3   Title  The patient will be able to walk at least 30 minutes comfortably  needed for work duties     Time  8    Period  Weeks    Status  New      PT LONG TERM GOAL #4   Title  Left hip abductor strength 4/5 and trunk flexor/extensor strength at least 4+/5 needed for standing and walking longer periods of time    Time  8    Period  Weeks    Status  New      PT LONG TERM GOAL #5   Title  FOTO functional outcome score improved from 53% limitation to 35% indicating improved function with less pain     Time  8    Period  Weeks    Status  New            Plan - 11/15/17 1439    Clinical Impression Statement  Pt arrived with some possible improvements in her pain in the mornings after completing prone press ups. Noted muscle spasm and soft tissue restrictions in the glutes and lumbar paraspinals. Pt was agreeable to dry needling and there were several twitch responses noted in the lumbar paraspinals with this. Also completed soft tissue mobilization to the area to further improve muscle spasm. Ended session with several additions to pt's HEP and pt reported resolved pain.  Rehab Potential  Good    Clinical Impairments Affecting Rehab Potential  none    PT Frequency  2x / week    PT Duration  8 weeks    PT Treatment/Interventions  ADLs/Self Care Home Management;Cryotherapy;Electrical Stimulation;Ultrasound;Traction;Moist Heat;Therapeutic exercise;Therapeutic activities;Patient/family education;Manual techniques;Dry needling;Taping    PT Next Visit Plan  f/u on d/n response; hip strengthening and deep abdominal strength progressions; manual as needed to address muscle spasm    PT Home Exercise Plan   Access Code:  [email or web address]     Consulted and Agree with Plan of Care  Patient       Patient will benefit from skilled therapeutic intervention in order to improve the following deficits and impairments:  Pain, Decreased range of motion, Decreased strength, Increased fascial restricitons, Difficulty walking, Impaired perceived functional ability  Visit Diagnosis: Chronic left-sided low back pain with left-sided sciatica  Muscle weakness (generalized)     Problem List There are no active problems to display for this patient.   2:47 PM,11/15/17 Donita Brooks PT, DPT [facility]  [phone]  [facility] [address], [address] [place], [place], [address] Phone: [phone]   Fax:  [phone]  Name: Anita Lyons MRN: [id number] Date of Birth: 1985/11/26

## 2017-11-15 NOTE — Patient Instructions (Signed)
Access Code: [email or web address]  URL: [email or web address]  Date: 11/15/2017  Prepared by: Marylyn IshiharaSara Kiser   Exercises  Prone Press Up - 10 reps - 1 sets - 1x daily - 7x weekly  Hooklying Transversus Abdominis Palpation - 10 reps - 1 sets - 5 hold - 1x daily - 7x weekly  Hooklying Isometric Clamshell - 10 reps - 3 sets - 1x daily - 7x weekly  Supine Bridge with Resistance Band - 10 reps - 3 sets - 1x daily - 7x weekly    [facility] [address], [address] [place], [place] [address] Phone # [phone] Fax [phone]

## 2017-11-17 ENCOUNTER — Ambulatory Visit: Payer: BC Managed Care – PPO | Admitting: Physical Therapy

## 2017-11-17 ENCOUNTER — Encounter: Payer: Self-pay | Admitting: Physical Therapy

## 2017-11-17 DIAGNOSIS — M6281 Muscle weakness (generalized): Secondary | ICD-10-CM

## 2017-11-17 DIAGNOSIS — G8929 Other chronic pain: Secondary | ICD-10-CM

## 2017-11-17 DIAGNOSIS — M5442 Lumbago with sciatica, left side: Secondary | ICD-10-CM | POA: Diagnosis not present

## 2017-11-17 NOTE — Therapy (Signed)
[facility] [address], [place], [place], [address] Phone: [phone]   Fax:  [phone]  Physical Therapy Treatment  Patient Details  Name: Anita Lyons MRN: [id number] Date of Birth: [birth year] Referring Provider: Dr. Ernie Hew   Encounter Date: 11/17/2017  PT End of Session - 11/17/17 1234    Visit Number  3    Date for PT Re-Evaluation  01/06/18    PT Start Time  1232    PT Stop Time  1310    PT Time Calculation (min)  38 min    Activity Tolerance  Patient tolerated treatment well    Behavior During Therapy  [facility] for tasks assessed/performed       Past Medical History:  Diagnosis Date  . No pertinent past medical history     Past Surgical History:  Procedure Laterality Date  . CERVICAL POLYPECTOMY    . DILATION AND CURETTAGE OF UTERUS      There were no vitals filed for this visit.  Subjective Assessment - 11/17/17 1236    Subjective  Reports she is doing very well. Not much pain.     Currently in Pain?  Yes    Pain Score  3     Pain Location  Back    Pain Orientation  Left;Lower    Pain Descriptors / Indicators  Dull    Multiple Pain Sites  No                       [facility] Adult PT Treatment/Exercise - 11/17/17 0001      Lumbar Exercises: Stretches   Double Knee to Chest Stretch  3 reps;20 seconds    Lower Trunk Rotation  -- To the RT 3x 20 sec    Pelvic Tilt  10 reps;5 seconds    Other Lumbar Stretch Exercise  childs pose out of extension ex 20 sec hold      Lumbar Exercises: Supine   Clam  20 reps blue band, Vc to engage lowewr abs    Bridge  10 reps;3 seconds      Lumbar Exercises: Prone   Straight Leg Raise  5 reps VC for core engagement    Other Prone Lumbar Exercises  TvA 5x with pelvic press 5x TC for proper form      Knee/Hip Exercises: Aerobic   Nustep  L2 x 10 min with lumbar roll  VC to maintain SPM > 90               PT Short Term Goals -  11/17/17 1245      PT SHORT TERM GOAL #1   Title  The patient will demonstrate basic self care strategies including use of lumbar roll when sitting, frequent change of position, basic ex    Time  4    Period  Weeks    Status  Achieved      PT SHORT TERM GOAL #2   Title  The patient will report a 30% improvement in back pain with home and work ADLs    Time  4    Period  Weeks    Status  On-going 25%        PT Long Term Goals - 11/11/17 2029      PT LONG TERM GOAL #1   Title  The patient will be independent in safe self progression of HEP    Time  8    Period  Weeks  Status  New    Target Date  01/06/18      PT LONG TERM GOAL #2   Title  The patient will report a 60% reduction in back pain with work and home ADLs    Time  8    Period  Weeks    Status  New      PT LONG TERM GOAL #3   Title  The patient will be able to walk at least 30 minutes comfortably  needed for work duties     Time  8    Period  Weeks    Status  New      PT LONG TERM GOAL #4   Title  Left hip abductor strength 4/5 and trunk flexor/extensor strength at least 4+/5 needed for standing and walking longer periods of time    Time  8    Period  Weeks    Status  New      PT LONG TERM GOAL #5   Title  FOTO functional outcome score improved from 53% limitation to 35% indicating improved function with less pain     Time  8    Period  Weeks    Status  New            Plan - 11/17/17 1235    Clinical Impression Statement  Pt reports she feels pain is 25% less at this point. She feels the dry needling has helped a lot to loosen her muscles.  She understands ways to support her back including using the towel roll and changing positions often. She has met 2 of her short term goals. She is indepemnent and compliant with her HEP.      Rehab Potential  Good    Clinical Impairments Affecting Rehab Potential  none    PT Frequency  2x / week    PT Duration  8 weeks    PT Treatment/Interventions  ADLs/Self  Care Home Management;Cryotherapy;Electrical Stimulation;Ultrasound;Traction;Moist Heat;Therapeutic exercise;Therapeutic activities;Patient/family education;Manual techniques;Dry needling;Taping    PT Next Visit Plan   hip strengthening and deep abdominal strength progressions; manual as needed to address muscle spasm    PT Home Exercise Plan   Access Code: [place] and Agree with Plan of Care  Patient       Patient will benefit from skilled therapeutic intervention in order to improve the following deficits and impairments:  Pain, Decreased range of motion, Decreased strength, Increased fascial restricitons, Difficulty walking, Impaired perceived functional ability  Visit Diagnosis: Chronic left-sided low back pain with left-sided sciatica  Muscle weakness (generalized)     Problem List There are no active problems to display for this patient.   Jennell Janosik, PTA 11/17/2017, 1:03 PM  [facility] [address] [place], [place], [address] Phone: [phone]   Fax:  [phone]  Name: Anita Lyons MRN: [id number] Date of Birth: 1985-06-10

## 2017-11-22 ENCOUNTER — Encounter: Payer: Self-pay | Admitting: Physical Therapy

## 2017-11-22 ENCOUNTER — Ambulatory Visit: Payer: BC Managed Care – PPO | Admitting: Physical Therapy

## 2017-11-22 DIAGNOSIS — M5442 Lumbago with sciatica, left side: Secondary | ICD-10-CM | POA: Diagnosis not present

## 2017-11-22 DIAGNOSIS — G8929 Other chronic pain: Secondary | ICD-10-CM

## 2017-11-22 DIAGNOSIS — M6281 Muscle weakness (generalized): Secondary | ICD-10-CM

## 2017-11-22 NOTE — Therapy (Signed)
[facility] [address], [address] [place], [place], [address] Phone: [phone]   Fax:  [phone]  Physical Therapy Treatment  Patient Details  Name: Anita AbeShanna N Lyons MRN: [id number] Date of Birth: [birth year] Referring Provider: Dr. Duanne Guessewey   Encounter Date: 11/22/2017  PT End of Session - 11/22/17 1231    Visit Number  4    Date for PT Re-Evaluation  01/06/18    PT Start Time  1230    PT Stop Time  1308    PT Time Calculation (min)  38 min    Activity Tolerance  Patient tolerated treatment well    Behavior During Therapy  [facility] for tasks assessed/performed       Past Medical History:  Diagnosis Date  . No pertinent past medical history     Past Surgical History:  Procedure Laterality Date  . CERVICAL POLYPECTOMY    . DILATION AND CURETTAGE OF UTERUS      There were no vitals filed for this visit.  Subjective Assessment - 11/22/17 1236    Subjective  Did well after last session but had some of the same lower left pain over the weekend.     Currently in Pain?  No/denies    Multiple Pain Sites  No                       [facility] Adult PT Treatment/Exercise - 11/22/17 0001      Lumbar Exercises: Stretches   Active Hamstring Stretch  Left;2 reps;20 seconds    Lower Trunk Rotation  --   To the RT 3x 20 sec   ITB Stretch  Left;2 reps;20 seconds   used yoga strap     Lumbar Exercises: Supine   Bridge  10 reps;5 seconds      Knee/Hip Exercises: Aerobic   Nustep  --   Recumbent bike L2 x 6 min, PTA got pt status     Knee/Hip Exercises: Sidelying   Hip ABduction  Strengthening;Left;2 sets;10 reps   VC for alignment, TA contraction     Manual Therapy   Soft tissue mobilization  STM lumbar paraspinals and Lt glute max; trigger point release Lt glute max               PT Short Term Goals - 11/17/17 1245      PT SHORT TERM GOAL #1   Title  The patient will demonstrate basic self care  strategies including use of lumbar roll when sitting, frequent change of position, basic ex    Time  4    Period  Weeks    Status  Achieved      PT SHORT TERM GOAL #2   Title  The patient will report a 30% improvement in back pain with home and work ADLs    Time  4    Period  Weeks    Status  On-going   25%       PT Long Term Goals - 11/11/17 2029      PT LONG TERM GOAL #1   Title  The patient will be independent in safe self progression of HEP    Time  8    Period  Weeks    Status  New    Target Date  01/06/18      PT LONG TERM GOAL #2   Title  The patient will report a 60% reduction in back pain with work and home ADLs  Time  8    Period  Weeks    Status  New      PT LONG TERM GOAL #3   Title  The patient will be able to walk at least 30 minutes comfortably  needed for work duties     Time  8    Period  Weeks    Status  New      PT LONG TERM GOAL #4   Title  Left hip abductor strength 4/5 and trunk flexor/extensor strength at least 4+/5 needed for standing and walking longer periods of time    Time  8    Period  Weeks    Status  New      PT LONG TERM GOAL #5   Title  FOTO functional outcome score improved from 53% limitation to 35% indicating improved function with less pain     Time  8    Period  Weeks    Status  New            Plan - 11/22/17 1236    Clinical Impression Statement  Pt presented today with no complaints of pain. She did report having pain over the weekend witha feeling of being 'tight" along her lower left back. Her Lt glutes proximally were tender with thickened tissue.  This improved with soft tissue mobilization. Pt visably shakey with bridge and sidelyinghip abduction exercises. Mainly cues on alignment needed.     Rehab Potential  Good    Clinical Impairments Affecting Rehab Potential  none    PT Frequency  2x / week    PT Duration  8 weeks    PT Treatment/Interventions  ADLs/Self Care Home Management;Cryotherapy;Electrical  Stimulation;Ultrasound;Traction;Moist Heat;Therapeutic exercise;Therapeutic activities;Patient/family education;Manual techniques;Dry needling;Taping    PT Next Visit Plan  Pt would like to dry needle next visit., Hip abductor strength    PT Home Exercise Plan   Access Code: [email or web address]     Consulted and Agree with Plan of Care  Patient       Patient will benefit from skilled therapeutic intervention in order to improve the following deficits and impairments:  Pain, Decreased range of motion, Decreased strength, Increased fascial restricitons, Difficulty walking, Impaired perceived functional ability  Visit Diagnosis: Chronic left-sided low back pain with left-sided sciatica  Muscle weakness (generalized)     Problem List There are no active problems to display for this patient.   Mahki Spikes, PTA 11/22/2017, 1:10 PM  [facility] [address], [address] [place], [place], [address] Phone: [phone]   Fax:  [phone]  Name: Anita AbeShanna N Lyons MRN: [id number] Date of Birth: 1985/05/09

## 2017-11-24 ENCOUNTER — Ambulatory Visit: Payer: BC Managed Care – PPO | Admitting: Physical Therapy

## 2017-11-24 ENCOUNTER — Encounter: Payer: Self-pay | Admitting: Physical Therapy

## 2017-11-24 DIAGNOSIS — M6281 Muscle weakness (generalized): Secondary | ICD-10-CM

## 2017-11-24 DIAGNOSIS — M5442 Lumbago with sciatica, left side: Secondary | ICD-10-CM | POA: Diagnosis not present

## 2017-11-24 DIAGNOSIS — G8929 Other chronic pain: Secondary | ICD-10-CM

## 2017-11-24 NOTE — Therapy (Signed)
[facility] [address], [address] [place], [place], [address] Phone: [phone]   Fax:  [phone]  Physical Therapy Treatment  Patient Details  Name: Anita Lyons MRN: [id number] Date of Birth: Jan 30, 1986 Referring Provider: Dr. Duanne Guess   Encounter Date: 11/24/2017  PT End of Session - 11/24/17 1313    Visit Number  5    Date for PT Re-Evaluation  01/06/18    PT Start Time  1231    PT Stop Time  1311    PT Time Calculation (min)  40 min    Activity Tolerance  Patient tolerated treatment well;No increased pain    Behavior During Therapy  [facility] for tasks assessed/performed       Past Medical History:  Diagnosis Date  . No pertinent past medical history     Past Surgical History:  Procedure Laterality Date  . CERVICAL POLYPECTOMY    . DILATION AND CURETTAGE OF UTERUS      There were no vitals filed for this visit.  Subjective Assessment - 11/24/17 1233    Subjective  Pt reports that things are going well. She has no issues with her exercises at this time. She feels that her pain is improved overall, but she thinks that when she goes several days between her sessions, she notices increase in Lt hip pain.     How long can you walk comfortably?  30-40 minutes    Currently in Pain?  No/denies                       [facility] Adult PT Treatment/Exercise - 11/24/17 0001      Exercises   Exercises  Knee/Hip      Knee/Hip Exercises: Standing   Other Standing Knee Exercises  hip hike 2x10 reps each LE    Other Standing Knee Exercises  staning firehydrants with yellow TB x15 reps each       Knee/Hip Exercises: Supine   Bridges  1 set;10 reps    Single Leg Bridge  Both;2 sets;5 reps      Knee/Hip Exercises: Sidelying   Hip ABduction  Left;2 sets;10 reps      Manual Therapy   Soft tissue mobilization  STM Lt gluteals       Trigger Point Dry Needling - 11/24/17 1312    Consent Given?  Yes     Education Handout Provided  No    Muscles Treated Lower Body  Gluteus minimus;Gluteus maximus   Lt   Gluteus Maximus Response  Twitch response elicited;Palpable increased muscle length    Gluteus Minimus Response  Twitch response elicited;Palpable increased muscle length           PT Education - 11/24/17 1313    Education Details  updated HEP    Person(s) Educated  Patient    Methods  Explanation;Verbal cues;Handout    Comprehension  Verbalized understanding;Returned demonstration       PT Short Term Goals - 11/24/17 1317      PT SHORT TERM GOAL #1   Title  The patient will demonstrate basic self care strategies including use of lumbar roll when sitting, frequent change of position, basic ex    Time  4    Period  Weeks    Status  Achieved      PT SHORT TERM GOAL #2   Title  The patient will report a 30% improvement in back pain with home and work ADLs  Time  4    Period  Weeks    Status  On-going   25%     PT SHORT TERM GOAL #3   Title  The patient will have improved core strength and pain reduction needed to walk 20-25 min with minimal pain     Baseline  pt able to walk 30-40 min    Time  4    Period  Weeks    Status  Achieved        PT Long Term Goals - 11/11/17 2029      PT LONG TERM GOAL #1   Title  The patient will be independent in safe self progression of HEP    Time  8    Period  Weeks    Status  New    Target Date  01/06/18      PT LONG TERM GOAL #2   Title  The patient will report a 60% reduction in back pain with work and home ADLs    Time  8    Period  Weeks    Status  New      PT LONG TERM GOAL #3   Title  The patient will be able to walk at least 30 minutes comfortably  needed for work duties     Time  8    Period  Weeks    Status  New      PT LONG TERM GOAL #4   Title  Left hip abductor strength 4/5 and trunk flexor/extensor strength at least 4+/5 needed for standing and walking longer periods of time    Time  8    Period  Weeks     Status  New      PT LONG TERM GOAL #5   Title  FOTO functional outcome score improved from 53% limitation to 35% indicating improved function with less pain     Time  8    Period  Weeks    Status  New            Plan - 11/24/17 1313    Clinical Impression Statement  Pt has been completing HEP regularly, and demonstrated good technique with bridges. This was updated and exercises were progressed to further improve LE strength. Pt has palpable tenderness along the gluteals and pt requested dry needling to the area. Local twitch response was noted and pt reported no increase in pain following today's treatment. Will continue with current POC to improve LE strength and decrease gluteal pain.     Rehab Potential  Good    Clinical Impairments Affecting Rehab Potential  none    PT Frequency  2x / week    PT Duration  8 weeks    PT Treatment/Interventions  ADLs/Self Care Home Management;Cryotherapy;Electrical Stimulation;Ultrasound;Traction;Moist Heat;Therapeutic exercise;Therapeutic activities;Patient/family education;Manual techniques;Dry needling;Taping    PT Next Visit Plan  f/u on dry needling and self trigger point release; progress hip extensor and hip abductor strength    PT Home Exercise Plan   Access Code: [email or web address]     Consulted and Agree with Plan of Care  Patient       Patient will benefit from skilled therapeutic intervention in order to improve the following deficits and impairments:  Pain, Decreased range of motion, Decreased strength, Increased fascial restricitons, Difficulty walking, Impaired perceived functional ability  Visit Diagnosis: Chronic left-sided low back pain with left-sided sciatica  Muscle weakness (generalized)     Problem List There are no active problems to display  for this patient.     1:19 PM,11/24/17 Donita BrooksSara Railey Glad PT, DPT [facility]  [phone]  [facility] [address], [address] [place], [place], [address] Phone: [phone]   Fax:  [phone]  Name: Anita Lyons MRN: [id number] Date of Birth: 02/09/1986

## 2017-12-02 ENCOUNTER — Encounter: Payer: Self-pay | Admitting: Physical Therapy

## 2017-12-02 ENCOUNTER — Ambulatory Visit: Payer: BC Managed Care – PPO | Admitting: Physical Therapy

## 2017-12-02 DIAGNOSIS — M5442 Lumbago with sciatica, left side: Secondary | ICD-10-CM | POA: Diagnosis not present

## 2017-12-02 DIAGNOSIS — M6281 Muscle weakness (generalized): Secondary | ICD-10-CM

## 2017-12-02 DIAGNOSIS — G8929 Other chronic pain: Secondary | ICD-10-CM

## 2017-12-02 NOTE — Therapy (Signed)
[facility] [address], [address] [place], [place], [address] Phone: [phone]   Fax:  [phone]  Physical Therapy Treatment  Patient Details  Name: Anita Lyons MRN: [id number] Date of Birth: [birth year] Referring Provider: Dr. Duanne Guess   Encounter Date: 12/02/2017  PT End of Session - 12/02/17 1612    Visit Number  6    Date for PT Re-Evaluation  01/06/18    PT Start Time  1538   pt late   PT Stop Time  1616    PT Time Calculation (min)  38 min    Activity Tolerance  Patient tolerated treatment well       Past Medical History:  Diagnosis Date  . No pertinent past medical history     Past Surgical History:  Procedure Laterality Date  . CERVICAL POLYPECTOMY    . DILATION AND CURETTAGE OF UTERUS      There were no vitals filed for this visit.  Subjective Assessment - 12/02/17 1540    Subjective  Likes the DN.  Reports she is doing well in the mornings and midday.  Worse by the end of the day after long commute to [facility].      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       Lumbar Exercises: Stretches   Other Lumbar Stretch Exercise  verbal review of current HEP      Lumbar Exercises: Supine   Basic Lumbar Stabilization Limitations  abdominal brace with single leg lower 50% of the way 10x right/left       Lumbar Exercises: Quadruped   Single Arm Raise  Right;Left;5 reps    Straight Leg Raise  5 reps    Opposite Arm/Leg Raise  Right arm/Left leg;Left arm/Right leg;5 reps      Modalities   Modalities  Moist Heat      Moist Heat Therapy   Number Minutes Moist Heat  5 Minutes   while discussing HEP   Moist Heat Location  Lumbar Spine;Hip      Manual Therapy   Soft tissue mobilization  left lumbar paraspinals, gluteals, piriformis        Trigger Point Dry Needling - 12/02/17 2248    Consent Given?  Yes  Muscles Treated Lower Body  Gluteus minimus;Gluteus maximus;Piriformis   left lumbar multifidi    Gluteus Maximus Response  Twitch response elicited;Palpable increased muscle length    Gluteus Minimus Response  Twitch response elicited;Palpable increased muscle length    Piriformis Response  Twitch response elicited;Palpable increased muscle length           PT Education - 12/02/17 1612    Education Details   Access Code: [email or web address] bird dogs, ab brace with leg lower    Person(s) Educated  Patient    Methods  Explanation;Demonstration;Handout    Comprehension  Returned demonstration;Verbalized understanding       PT Short Term Goals - 11/24/17 1317      PT SHORT TERM GOAL #1   Title  The patient will demonstrate basic self care strategies including use of lumbar roll when sitting, frequent change of position, basic ex    Time  4    Period  Weeks    Status  Achieved      PT SHORT TERM GOAL #2   Title  The patient will report a 30% improvement in back pain with home and work ADLs    Time  4    Period  Weeks    Status  On-going   25%     PT SHORT TERM GOAL #3   Title  The patient will have improved core strength and pain reduction needed to walk 20-25 min with minimal pain     Baseline  pt able to walk 30-40 min    Time  4     Period  Weeks    Status  Achieved        PT Long Term Goals - 11/11/17 2029      PT LONG TERM GOAL #1   Title  The patient will be independent in safe self progression of HEP    Time  8    Period  Weeks    Status  New    Target Date  01/06/18      PT LONG TERM GOAL #2   Title  The patient will report a 60% reduction in back pain with work and home ADLs    Time  8    Period  Weeks    Status  New      PT LONG TERM GOAL #3   Title  The patient will be able to walk at least 30 minutes comfortably  needed for work duties     Time  8    Period  Weeks    Status  New      PT LONG TERM GOAL #4   Title  Left hip abductor strength 4/5 and trunk flexor/extensor strength at least 4+/5 needed for standing and walking longer periods of time    Time  8    Period  Weeks    Status  New      PT LONG TERM GOAL #5   Title  FOTO functional outcome score improved from 53% limitation to 35% indicating improved function with less pain     Time  8    Period  Weeks    Status  New            Plan - 12/02/17 1608    Clinical Impression Statement  The patient reports a good response to DN.  Improving transverse abdominus muscle activation and is able to perform single leg lowering without back pain and with good control.  Weakness with left  hip abduction MMT and pelvic drop noted during weight bearing phase with bird dog exercise.  Will decrease frequency to 1x/week.      Rehab Potential  Good    Clinical Impairments Affecting Rehab Potential  none    PT Frequency  2x / week    PT Duration  8 weeks    PT Treatment/Interventions  ADLs/Self Care Home Management;Cryotherapy;Electrical Stimulation;Ultrasound;Traction;Moist Heat;Therapeutic exercise;Therapeutic activities;Patient/family education;Manual techniques;Dry needling;Taping    PT Next Visit Plan   check % improvement for STG;  dry needling and self trigger point release; progress hip extensor and hip abductor strength left     PT Home  Exercise Plan   Access Code: [facility]        Patient will benefit from skilled therapeutic intervention in order to improve the following deficits and impairments:  Pain, Decreased range of motion, Decreased strength, Increased fascial restricitons, Difficulty walking, Impaired perceived functional ability  Visit Diagnosis: Chronic left-sided low back pain with left-sided sciatica  Muscle weakness (generalized)     Problem List There are no active problems to display for this patient.  Lavinia Sharps, PT 12/02/17 10:59 PM Phone: [phone] Fax: [phone]  Anita Lyons 12/02/2017, 10:59 PM  [facility] [address] [place], [place], [address] Phone: [phone]   Fax:  [phone]  Name: Anita Lyons MRN: [id number] Date of Birth: [birth year]

## 2017-12-02 NOTE — Patient Instructions (Addendum)
   Access Code: [email or web address]  URL: [email or web address]  Date: 12/02/2017  Prepared by: Lavinia SharpsStacy Simpson   Exercises  Prone Press Up - 10 reps - 1 sets - 1x daily - 7x weekly  Hooklying Transversus Abdominis Palpation - 10 reps - 1 sets - 5 hold - 1x daily - 7x weekly  Hooklying Isometric Clamshell - 10 reps - 3 sets - 1x daily - 7x weekly  Single Leg Bridge with Leg Supported - 5 reps - 3 sets - 1x daily - 7x weekly  Supine Transversus Abdominis Bracing with Leg Extension - 10 reps - 1 sets - 1x daily - 7x weekly  Bird Dog - 10 reps - 1 sets - 1x daily - 7x weekly    Lavinia SharpsStacy Simpson PT [facility] [address], [address] [place], [place] [address] Phone # [phone] Fax [phone]

## 2017-12-09 ENCOUNTER — Encounter: Payer: Self-pay | Admitting: Physical Therapy

## 2017-12-09 ENCOUNTER — Ambulatory Visit: Payer: BC Managed Care – PPO | Admitting: Physical Therapy

## 2017-12-09 DIAGNOSIS — M5442 Lumbago with sciatica, left side: Secondary | ICD-10-CM | POA: Diagnosis not present

## 2017-12-09 DIAGNOSIS — M6281 Muscle weakness (generalized): Secondary | ICD-10-CM

## 2017-12-09 DIAGNOSIS — G8929 Other chronic pain: Secondary | ICD-10-CM

## 2017-12-09 NOTE — Therapy (Signed)
[facility] [address] [place], [place], [address] Phone: [phone]   Fax:  [phone]  Physical Therapy Treatment  Patient Details  Name: Anita Lyons MRN: [id number] Date of Birth: 08/23/1985 Referring Provider: Dr. Duanne Guess   Encounter Date: 12/09/2017  PT End of Session - 12/09/17 0818    Visit Number  7    Date for PT Re-Evaluation  01/06/18    PT Start Time  0801    PT Stop Time  0840    PT Time Calculation (min)  39 min    Activity Tolerance  Patient tolerated treatment well;No increased pain    Behavior During Therapy  WFL for tasks assessed/performed       Past Medical History:  Diagnosis Date  . No pertinent past medical history     Past Surgical History:  Procedure Laterality Date  . CERVICAL POLYPECTOMY    . DILATION AND CURETTAGE OF UTERUS      There were no vitals filed for this visit.  Subjective Assessment - 12/09/17 0804    Subjective  Pt reports that things are going well. She is back at work and in the mornings will notice her back is doing pretty good. Usually by the evenings her back is a little tired and achy.     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  No/denies   no pain unless lateral flexion                      [facility] Adult PT Treatment/Exercise - 12/09/17 0001      Exercises   Exercises  Lumbar      Lumbar Exercises: Supine   Other Supine Lumbar Exercises  bent knee 90/90 with alternating heel tap 2x5 reps       Lumbar Exercises: Quadruped   Madcat/Old Horse  15 reps    Straight Leg Raise  10 reps    Straight Leg Raises Limitations  Rt leg, with tactile cuing at hips to prevent weight shift     Opposite Arm/Leg Raise  Right arm/Left leg;10 reps    Opposite Arm/Leg Raise Limitations  tactile cuing to prevent weight shift       Knee/Hip Exercises: Standing   Other Standing Knee Exercises  Lt and Rt hip hike/lower from step x15 reps each       Manual Therapy   Joint Mobilization  Grade III-IV CPAs x2 bouts L3 to S1    Soft tissue mobilization  STM lt lumbar paraspinals, QL, proximal glutes        Trigger Point Dry Needling - 12/09/17 0819    Consent Given?  Yes    Muscles Treated Lower Body  --   Lt lumbar multifidi L4, L5 (+) twitch response noted           PT Education - 12/09/17 0818    Education Details  technique with therex     Person(s) Educated  Patient    Methods  Explanation;Verbal cues;Tactile cues    Comprehension  Verbalized understanding;Returned demonstration       PT Short Term Goals - 12/09/17 0843      PT SHORT TERM GOAL #1   Title  The patient will demonstrate basic self care strategies including use of lumbar roll when sitting, frequent change of position, basic ex  Time  4    Period  Weeks    Status  Achieved      PT SHORT TERM GOAL #2   Title  The patient will report a 30% improvement in back pain with home and work ADLs    Baseline  50% improvement     Time  4    Period  Weeks    Status  Achieved   25%     PT SHORT TERM GOAL #3   Title  The patient will have improved core strength and pain reduction needed to walk 20-25 min with minimal pain     Baseline  pt able to walk 30-40 min    Time  4    Period  Weeks    Status  Achieved        PT Long Term Goals - 11/11/17 2029      PT LONG TERM GOAL #1   Title  The patient will be independent in safe self progression of HEP    Time  8    Period  Weeks    Status  New    Target Date  01/06/18      PT LONG TERM GOAL #2   Title  The patient will report a 60% reduction in back pain with work and home ADLs    Time  8    Period  Weeks    Status  New      PT LONG TERM GOAL #3   Title  The patient will be able to walk at least 30 minutes comfortably  needed for work duties     Time  8    Period  Weeks    Status  New      PT LONG TERM GOAL  #4   Title  Left hip abductor strength 4/5 and trunk flexor/extensor strength at least 4+/5 needed for standing and walking longer periods of time    Time  8    Period  Weeks    Status  New      PT LONG TERM GOAL #5   Title  FOTO functional outcome score improved from 53% limitation to 35% indicating improved function with less pain     Time  8    Period  Weeks    Status  New            Plan - 12/09/17 0840    Clinical Impression Statement  Pt reports atleast 50% improvement in her back pain with return to school. She is able to go most of the day without pain, noting low back fatigue mostly on her drive back home at the end of the day. Session focused on therex to promote trunk and hip strength, pt was able to maintain proper technique with quadruped exercise with tactile cues this session. Also completed dry needling to lumbar multifidi with additional manual techniques to further decrease pain and muscle spasm. Will continue with current POC.     Rehab Potential  Good    Clinical Impairments Affecting Rehab Potential  none    PT Frequency  2x / week    PT Duration  8 weeks    PT Treatment/Interventions  ADLs/Self Care Home Management;Cryotherapy;Electrical Stimulation;Ultrasound;Traction;Moist Heat;Therapeutic exercise;Therapeutic activities;Patient/family education;Manual techniques;Dry needling;Taping    PT Next Visit Plan   dry needling and self trigger point release; progress hip extensor and hip abductor strength left     PT Home Exercise Plan   [email or web address]  Patient will benefit from skilled therapeutic intervention in order to improve the following deficits and impairments:  Pain, Decreased range of motion, Decreased strength, Increased fascial restricitons, Difficulty walking, Impaired perceived functional ability  Visit Diagnosis: Chronic left-sided low back pain with left-sided sciatica  Muscle weakness (generalized)     Problem List There are no  active problems to display for this patient.   8:43 AM,12/09/17 Donita Brooks PT, DPT [facility]  [phone]  [facility] [address], [address] [place], [place], [address] Phone: [phone]   Fax:  [phone]  Name: Anita Lyons MRN: [id number] Date of Birth: [birth year]

## 2017-12-16 ENCOUNTER — Encounter

## 2017-12-21 ENCOUNTER — Encounter: Payer: Self-pay | Admitting: Physical Therapy

## 2017-12-21 ENCOUNTER — Ambulatory Visit: Payer: BC Managed Care – PPO | Attending: Family Medicine | Admitting: Physical Therapy

## 2017-12-21 DIAGNOSIS — M5442 Lumbago with sciatica, left side: Secondary | ICD-10-CM | POA: Insufficient documentation

## 2017-12-21 DIAGNOSIS — G8929 Other chronic pain: Secondary | ICD-10-CM

## 2017-12-21 DIAGNOSIS — M6281 Muscle weakness (generalized): Secondary | ICD-10-CM | POA: Insufficient documentation

## 2017-12-21 NOTE — Patient Instructions (Signed)
Access Code: [email or web address]  URL: [email or web address]  Date: 12/21/2017  Prepared by: Lavinia Sharps   Exercises  Prone Press Up - 10 reps - 1 sets - 1x daily - 7x weekly  Hooklying Transversus Abdominis Palpation - 10 reps - 1 sets - 5 hold - 1x daily - 7x weekly  Hooklying Isometric Clamshell - 10 reps - 3 sets - 1x daily - 7x weekly  Single Leg Bridge with Leg Supported - 5 reps - 3 sets - 1x daily - 7x weekly  Supine Transversus Abdominis Bracing with Leg Extension - 10 reps - 1 sets - 1x daily - 7x weekly  Bird Dog - 10 reps - 1 sets - 1x daily - 7x weekly  Standing Quadratus Lumborum Stretch with Doorway - 3 reps - 1 sets - 20 hold - 1x daily - 7x weekly  Sidelying Thoracic Lumbar Rotation - 3 reps - 1 sets - 20 hold - 1x daily - 7x weekly  Side Stepping with Resistance at Thighs - 10 reps - 1 sets - 1x daily - 7x weekly  Forward Monster Walk with Resistance (BKA) - 10 reps - 1 sets - 1x daily - 7x weekly  Diagonal Hip Extension with Resistance - 10 reps - 1 sets - 1x daily - 7x weekly    Lavinia Sharps PT [facility] [address] [place], [place] [address] Phone # [phone] Fax [phone]

## 2017-12-21 NOTE — Therapy (Signed)
[facility] [address] [place], [place], [address] Phone: [phone]   Fax:  [phone]  Physical Therapy Treatment  Patient Details  Name: Anita Lyons MRN: [id number] Date of Birth: 31-May-1985 Referring Provider: Dr. Duanne Guess   Encounter Date: 12/21/2017  PT End of Session - 12/21/17 0829    Visit Number  8    Date for PT Re-Evaluation  01/06/18    PT Start Time  0737    PT Stop Time  0825    PT Time Calculation (min)  48 min    Activity Tolerance  Patient tolerated treatment well       Past Medical History:  Diagnosis Date  . No pertinent past medical history     Past Surgical History:  Procedure Laterality Date  . CERVICAL POLYPECTOMY    . DILATION AND CURETTAGE OF UTERUS      There were no vitals filed for this visit.  Subjective Assessment - 12/21/17 0738    Subjective  The DN has helped.  Mornings are better.  LBP no LE symptoms.     Currently in Pain?  Yes    Pain Score  3     Pain Location  Back    Pain Orientation  Left    Pain Type  Chronic pain    Aggravating Factors   as the day goes on;  bend down to the side and forward     Pain Relieving Factors  stretching it out;  massage         [facility] PT Assessment - 12/21/17 0001      Observation/Other Assessments   Focus on Therapeutic Outcomes (FOTO)   33% limitation      AROM   Lumbar - Right Side Bend  40    Lumbar - Left Side Bend  40      Strength   Left Hip Extension  4+/5    Left Hip ABduction  4+/5                   [facility] Adult PT Treatment/Exercise - 12/21/17 0001      Lumbar Exercises: Stretches   Lower Trunk Rotation  1 rep;20 seconds    Other Lumbar Stretch Exercise  see HEP       Lumbar Exercises: Supine   Other Supine Lumbar Exercises  verbal review      Lumbar Exercises: Quadruped   Opposite Arm/Leg Raise Limitations  verbal review      Knee/Hip Exercises: Standing   Other Standing Knee  Exercises  green band around thighs side stepping, monster walks, tight rope walk 2 laps each    Other Standing Knee Exercises  green band hip extension/abduction diagonals 10x      Moist Heat Therapy   Number Minutes Moist Heat  13 Minutes    Moist Heat Location  Lumbar Spine;Hip      Electrical Stimulation   Electrical Stimulation Location  back and left hip     Electrical Stimulation Action  IFC    Electrical Stimulation Parameters  6 ma 13 min     Electrical Stimulation Goals  Pain      Manual Therapy   Joint Mobilization  neutral gapping flexion/rotation and pelvic distraction grade 4 3x 30 sec each     Soft tissue mobilization  left lumbar paraspinals, gluteals, left QL       Trigger Point Dry Needling - 12/21/17 0811    Consent Given?  Yes  Muscles Treated Upper Body  Quadratus Lumborum    Gluteus Maximus Response  Twitch response elicited;Palpable increased muscle length           PT Education - 12/21/17 0817    Education Details  green band sidestep and monster walks; hip band diagonals;  QL stretches    Person(s) Educated  Patient    Methods  Explanation;Demonstration;Handout    Comprehension  Returned demonstration;Verbalized understanding       PT Short Term Goals - 12/21/17 0838      PT SHORT TERM GOAL #1   Title  The patient will demonstrate basic self care strategies including use of lumbar roll when sitting, frequent change of position, basic ex    Status  Achieved      PT SHORT TERM GOAL #2   Title  The patient will report a 30% improvement in back pain with home and work ADLs    Status  Achieved      PT SHORT TERM GOAL #3   Title  The patient will have improved core strength and pain reduction needed to walk 20-25 min with minimal pain     Status  Achieved        PT Long Term Goals - 12/21/17 2956      PT LONG TERM GOAL #1   Title  The patient will be independent in safe self progression of HEP    Time  8    Period  Weeks    Status   On-going      PT LONG TERM GOAL #2   Title  The patient will report a 60% reduction in back pain with work and home ADLs    Time  8    Period  Weeks    Status  On-going      PT LONG TERM GOAL #3   Title  The patient will be able to walk at least 30 minutes comfortably  needed for work duties     Time  8    Period  Weeks    Status  On-going      PT LONG TERM GOAL #4   Title  Left hip abductor strength 4/5 and trunk flexor/extensor strength at least 4+/5 needed for standing and walking longer periods of time    Time  8    Period  Weeks    Status  On-going      PT LONG TERM GOAL #5   Title  FOTO functional outcome score improved from 53% limitation to 35% indicating improved function with less pain     Status  Achieved            Plan - 12/21/17 0812    Clinical Impression Statement  The patient has made good improvements in function with reduced pain as evident by FOTO functional outcome score improved significantly from initial score.  Improving left hip abductor and extensor strength as well but left pelvic drop still evident with single leg stand.  Good response to ES/heat.  On track to meet LTGs in the next 2-3 weeks.      Rehab Potential  Good    Clinical Impairments Affecting Rehab Potential  none    PT Frequency  2x / week    PT Duration  8 weeks    PT Treatment/Interventions  ADLs/Self Care Home Management;Cryotherapy;Electrical Stimulation;Ultrasound;Traction;Moist Heat;Therapeutic exercise;Therapeutic activities;Patient/family education;Manual techniques;Dry needling;Taping    PT Next Visit Plan   assess response to dry needling left QL; progress hip extensor  and hip abductor strength left ;  multifidi series;  [place] series;  ES/heat as needed    PT Home Exercise Plan   Access Code: [email or web address]        Patient will benefit from skilled therapeutic intervention in order to improve the following deficits and impairments:  Pain, Decreased range of motion, Decreased  strength, Increased fascial restricitons, Difficulty walking, Impaired perceived functional ability  Visit Diagnosis: Chronic left-sided low back pain with left-sided sciatica  Muscle weakness (generalized)     Problem List There are no active problems to display for this patient.  Lavinia Sharps, PT 12/21/17 8:41 AM Phone: [phone] Fax: [phone]  Vivien Presto 12/21/2017, 8:41 AM  [facility] [address] [place], [place], [address] Phone: [phone]   Fax:  [phone]  Name: PRIYA MATSEN MRN: [id number] Date of Birth: [birth year]

## 2017-12-28 ENCOUNTER — Encounter: Payer: BC Managed Care – PPO | Admitting: Physical Therapy

## 2018-01-04 ENCOUNTER — Encounter: Payer: Self-pay | Admitting: Physical Therapy

## 2018-01-04 ENCOUNTER — Ambulatory Visit: Payer: BC Managed Care – PPO | Admitting: Physical Therapy

## 2018-01-04 DIAGNOSIS — M5442 Lumbago with sciatica, left side: Principal | ICD-10-CM

## 2018-01-04 DIAGNOSIS — M6281 Muscle weakness (generalized): Secondary | ICD-10-CM

## 2018-01-04 DIAGNOSIS — G8929 Other chronic pain: Secondary | ICD-10-CM

## 2018-01-04 NOTE — Therapy (Signed)
[facility] [address], [place], [place], [address] Phone: [phone]   Fax:  [phone]  Physical Therapy Treatment/Recertification   Patient Details  Name: Anita Lyons MRN: [id number] Date of Birth: Jan 03, 1986 Referring Provider: Dr. Ernie Hew   Encounter Date: 01/04/2018  PT End of Session - 01/04/18 1707    Visit Number  9    Date for PT Re-Evaluation  03/01/18    PT Start Time  1622    PT Stop Time  1700    PT Time Calculation (min)  38 min    Activity Tolerance  Patient tolerated treatment well       Past Medical History:  Diagnosis Date  . No pertinent past medical history     Past Surgical History:  Procedure Laterality Date  . CERVICAL POLYPECTOMY    . DILATION AND CURETTAGE OF UTERUS      There were no vitals filed for this visit.  Subjective Assessment - 01/04/18 1622    Subjective  I had to miss last appt b/c daughter has been sick.  I'm feeling pretty good.  Just drove from work in [place].  Not every day throbbing pain anymore. Less morning pain.  Overall 80% overall.      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  Yes    Pain Score  4     Pain Location  Back    Pain Orientation  Left    Aggravating Factors   bending to the left; arching my back;  bending over;  end of the day;  after commute    Pain Relieving Factors  stretching it a lot;  lumbar rotation stretch; press ups         [facility] PT Assessment - 01/04/18 0001      Observation/Other Assessments   Focus on Therapeutic Outcomes (FOTO)   31% limitation       AROM   Lumbar Flexion  85    Lumbar - Right Side Bend  40    Lumbar - Left Side Bend  35      Strength   Left Hip Extension  4+/5    Left Hip ABduction  4+/5    Lumbar Flexion  4/5    Lumbar Extension  4/5                    [facility] Adult PT Treatment/Exercise - 01/04/18 0001      Lumbar Exercises: Aerobic   Elliptical  3 min L5 incline      Lumbar Exercises: Standing   Other Standing Lumbar Exercises  SLS Pallof green band press forward, overhead, march, backward lunge    Other Standing Lumbar Exercises  SLS with green band diagonals UE 10x right/left       Lumbar Exercises: Supine   Bent Knee Raise  5 reps    Straight Leg Raise  5 reps      Lumbar Exercises: Prone   Other Prone Lumbar Exercises  multidifi press with hip extension, bent knee hip extension 5x each bil     Other Prone Lumbar Exercises  modified planks 3x 10 sec hold      Lumbar Exercises: Quadruped   Opposite Arm/Leg Raise  Right arm/Left leg;Left arm/Right leg;5 reps  PT Education - 01/04/18 1704    Education Details   Access Code: [email or web address]   Pallof series;  multifidi press prone;  modified planks    Person(s) Educated  Patient    Methods  Explanation;Handout    Comprehension  Returned demonstration;Verbalized understanding       PT Short Term Goals - 01/04/18 1715      PT SHORT TERM GOAL #1   Title  The patient will demonstrate basic self care strategies including use of lumbar roll when sitting, frequent change of position, basic ex    Status  Achieved      PT SHORT TERM GOAL #2   Title  The patient will report a 30% improvement in back pain with home and work ADLs    Status  Achieved      PT SHORT TERM GOAL #3   Title  The patient will have improved core strength and pain reduction needed to walk 20-25 min with minimal pain     Status  Achieved        PT Long Term Goals - 01/04/18 1638      PT LONG TERM GOAL #1   Title  The patient will be independent in safe self progression of HEP    Status  On-going    Target Date  02/15/18      PT LONG TERM GOAL #2   Title  The patient will report a 60% reduction in back pain with work and home ADLs    Status  Achieved      PT  LONG TERM GOAL #3   Title  The patient will be able to walk at least 30 minutes comfortably  needed for work duties     Status  Achieved      PT [place] #4   Title  Left hip abductor strength 4/5 and trunk flexor/extensor strength at least 4+/5 needed for standing and walking longer periods of time    Time  8    Period  Weeks    Status  Partially Met      PT LONG TERM GOAL #5   Title  FOTO functional outcome score improved from 53% limitation to 35% indicating improved function with less pain     Status  Achieved      Additional Long Term Goals   Additional Long Term Goals  Yes            Plan - 01/04/18 1642    Clinical Impression Statement  The patient reports she is overall 80 % better however she has not returned to more higher level activities such as running and 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  Her core and left LE has improved but she continues to have difficulty stabilizing with single leg standing/full weight bearing on that side.  She would benefit from 1-2 more visits for higher level stabilization and strengthening.      Rehab Potential  Good    PT Frequency  Biweekly    PT Duration  6 weeks    PT Treatment/Interventions  ADLs/Self Care Home Management;Cryotherapy;Electrical Stimulation;Ultrasound;Traction;Moist Heat;Therapeutic exercise;Therapeutic activities;Patient/family education;Manual techniques;Dry needling;Taping    PT Next Visit Plan  Elliptical; higher level core and left hip strengthening;  DN if needed;  ES/heat as needed    PT Home Exercise Plan   Access Code: [facility]        Patient will benefit from skilled therapeutic intervention in order to improve the  following deficits and impairments:  Pain, Decreased range of motion, Decreased strength, Increased fascial restricitons, Difficulty walking, Impaired perceived functional ability  Visit Diagnosis: Chronic left-sided low back pain with left-sided sciatica  - Plan: PT plan of care cert/re-cert  Muscle weakness (generalized) - Plan: PT plan of care cert/re-cert     Problem List There are no active problems to display for this patient.  Ruben Im, PT 01/04/18 5:19 PM Phone: [phone] Fax: [phone]  Alvera Singh 01/04/2018, 5:18 PM  [facility] [address] [place], [place], [address] Phone: [phone]   Fax:  [phone]  Name: Anita Lyons MRN: [id number] Date of Birth: Mar 18, 1986

## 2018-01-04 NOTE — Patient Instructions (Addendum)
Stacy Simpson PT [facility] [address], [address] [place], [place] [address] Phone # [phone] Fax [phone]    

## 2018-01-20 ENCOUNTER — Ambulatory Visit: Payer: BC Managed Care – PPO | Admitting: Physical Therapy

## 2018-01-27 ENCOUNTER — Encounter: Payer: Self-pay | Admitting: Physical Therapy

## 2018-01-27 ENCOUNTER — Ambulatory Visit: Payer: BC Managed Care – PPO | Attending: Family Medicine | Admitting: Physical Therapy

## 2018-01-27 DIAGNOSIS — G8929 Other chronic pain: Secondary | ICD-10-CM | POA: Insufficient documentation

## 2018-01-27 DIAGNOSIS — M5442 Lumbago with sciatica, left side: Secondary | ICD-10-CM | POA: Insufficient documentation

## 2018-01-27 DIAGNOSIS — M6281 Muscle weakness (generalized): Secondary | ICD-10-CM | POA: Insufficient documentation

## 2018-01-27 NOTE — Therapy (Addendum)
[facility] [address], [place], [place], [address] Phone: [phone]   Fax:  [phone]  Physical Therapy Treatment/Discharge Summary   Patient Details  Name: Anita Lyons MRN: [id number] Date of Birth: 30-Jan-1986 Referring Provider (PT): Dr. Ernie Hew   Encounter Date: 01/27/2018  PT End of Session - 01/27/18 0831    Visit Number  10    Date for PT Re-Evaluation  03/01/18    PT Start Time  0800    PT Stop Time  1601    PT Time Calculation (min)  38 min    Activity Tolerance  Patient tolerated treatment well       Past Medical History:  Diagnosis Date  . No pertinent past medical history     Past Surgical History:  Procedure Laterality Date  . CERVICAL POLYPECTOMY    . DILATION AND CURETTAGE OF UTERUS      There were no vitals filed for this visit.  Subjective Assessment - 01/27/18 0803    Subjective  My back is better.  I mostly just feel it at the end of the day after driving back and forth to work.  Doing exercises 3-4x a week in the mornings. Haven't had time to try running yet.       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  No/denies    Pain Score  0-No pain    Pain Location  Back    Pain Type  Chronic pain    Aggravating Factors   end of the day after long commute;  bending to the left         [facility] PT Assessment - 01/27/18 0001      AROM   Lumbar Flexion  90    Lumbar Extension  30    Lumbar - Right Side Bend  40    Lumbar - Left Side Bend  38      Strength   Left Hip Extension  4+/5    Left Hip ABduction  4+/5    Lumbar Flexion  4/5    Lumbar Extension  4/5                   [facility] Adult PT Treatment/Exercise - 01/27/18 0001      Lumbar Exercises: Aerobic   Elliptical  4 min L5 incline      Lumbar Exercises: Machines for  Strengthening   Leg Press  seat 6 70# bil 15x; 40# single legs 15x each     Other Lumbar Machine Exercise  lat bar 20# 15x    Other Lumbar Machine Exercise  seated row 20# 15x      Lumbar Exercises: Standing   Other Standing Lumbar Exercises  SLS Pallof red band press forward, overhead, march, backward lunge      Lumbar Exercises: Supine   Straight Leg Raise  5 reps      Lumbar Exercises: Sidelying   Hip Abduction  Left;10 reps      Lumbar Exercises: Prone   Other Prone Lumbar Exercises  multidifi press with hip extension, bent knee hip extension 5x each bil     Other Prone Lumbar Exercises  modified planks 3x 10 sec hold      Lumbar Exercises: Quadruped   Opposite Arm/Leg Raise  Right arm/Left leg;Left  arm/Right leg;5 reps      Knee/Hip Exercises: Aerobic   Nustep  L3 4 min                PT Short Term Goals - 01/04/18 1715      PT SHORT TERM GOAL #1   Title  The patient will demonstrate basic self care strategies including use of lumbar roll when sitting, frequent change of position, basic ex    Status  Achieved      PT SHORT TERM GOAL #2   Title  The patient will report a 30% improvement in back pain with home and work ADLs    Status  Achieved      PT SHORT TERM GOAL #3   Title  The patient will have improved core strength and pain reduction needed to walk 20-25 min with minimal pain     Status  Achieved        PT Long Term Goals - 01/04/18 1638      PT LONG TERM GOAL #1   Title  The patient will be independent in safe self progression of HEP    Status  On-going    Target Date  02/15/18      PT LONG TERM GOAL #2   Title  The patient will report a 60% reduction in back pain with work and home ADLs    Status  Achieved      PT LONG TERM GOAL #3   Title  The patient will be able to walk at least 30 minutes comfortably  needed for work duties     Status  Achieved      PT [place] #4   Title  Left hip abductor strength 4/5 and trunk  flexor/extensor strength at least 4+/5 needed for standing and walking longer periods of time    Time  8    Period  Weeks    Status  Partially Met      PT LONG TERM GOAL #5   Title  FOTO functional outcome score improved from 53% limitation to 35% indicating improved function with less pain     Status  Achieved      Additional Long Term Goals   Additional Long Term Goals  Yes            Plan - 01/27/18 4098    Clinical Impression Statement  The patient is progressing very well with lumbo/pelvic/hip core stabilization.  Left gluteals continue to be weaker than the right as evident with in quadruped and standing positions.   She is able to participate in a moderate intensity program without pain exacerbation.      Rehab Potential  Good    Clinical Impairments Affecting Rehab Potential  none    PT Frequency  Biweekly    PT Duration  6 weeks    PT Treatment/Interventions  ADLs/Self Care Home Management;Cryotherapy;Electrical Stimulation;Ultrasound;Traction;Moist Heat;Therapeutic exercise;Therapeutic activities;Patient/family education;Manual techniques;Dry needling;Taping    PT Next Visit Plan  Anticipate readiness for discharge next visit;  recheck FOTO;  Elliptical; higher level core and left hip strengthening;  DN if needed;  ES/heat as needed    PT Home Exercise Plan   Access Code: [facility]        Patient will benefit from skilled therapeutic intervention in order to improve the following deficits and impairments:  Pain, Decreased range of motion, Decreased strength, Increased fascial restricitons, Difficulty walking, Impaired perceived functional ability  Visit Diagnosis: Chronic left-sided low back pain with  left-sided sciatica  Muscle weakness (generalized)    PHYSICAL THERAPY DISCHARGE SUMMARY  Visits from Start of Care: 10  Current functional level related to goals / functional outcomes: The patient was unable to attend her last scheduled PT for planned discharge  secondary to her daughter was sick.  She progressed very well with PT and the majority of rehab goals were met.    Remaining deficits: As above   Education / Equipment: Comprehensive HEP Plan: Patient agrees to discharge.  Patient goals were partially met. Patient is being discharged due to not returning since the last visit.  ?????      Problem List There are no active problems to display for this patient.  Ruben Im, PT 01/27/18 5:18 PM Phone: [phone] Fax: [phone]  Alvera Singh 01/27/2018, 5:17 PM  [facility] [address] [place], [place], [address] Phone: [phone]   Fax:  [phone]  Name: Anita Lyons MRN: [id number] Date of Birth: 12/27/1985

## 2018-02-01 ENCOUNTER — Ambulatory Visit: Payer: BC Managed Care – PPO | Admitting: Physical Therapy

## 2018-08-01 IMAGING — US US ABDOMEN COMPLETE
1 series · 14 of 25 positions shown · non-contrast
Comparison: None

CLINICAL DATA: LEFT low back and LEFT lower quadrant pain for 1
year worse in last 3-4 months

EXAM:
ABDOMEN ULTRASOUND COMPLETE

[Series 1: us abdomen complete · 0.19mm/px · 14 of 83 slices shown]
[im 1/83]
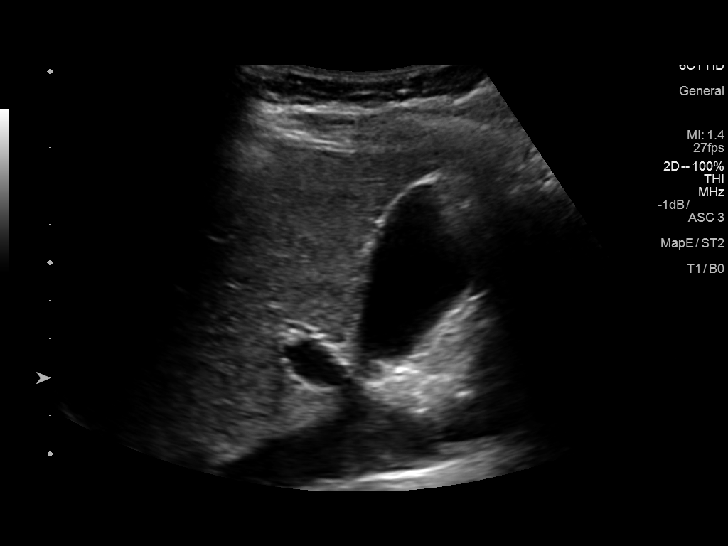
[im 7/83]
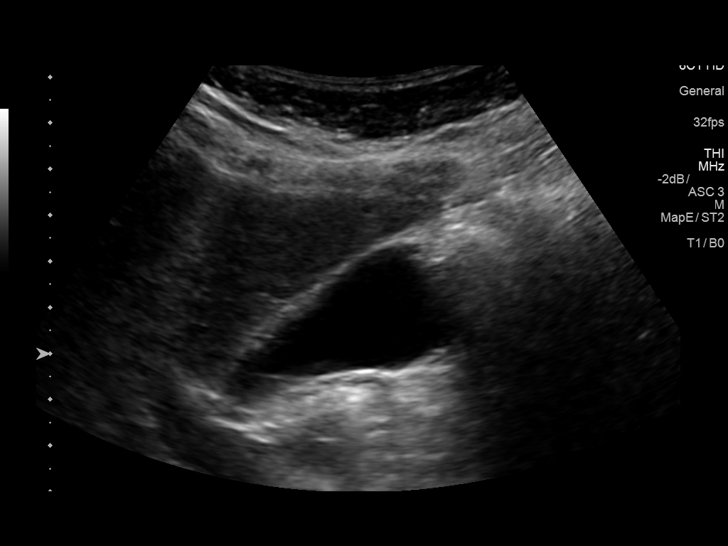
[im 14/83]
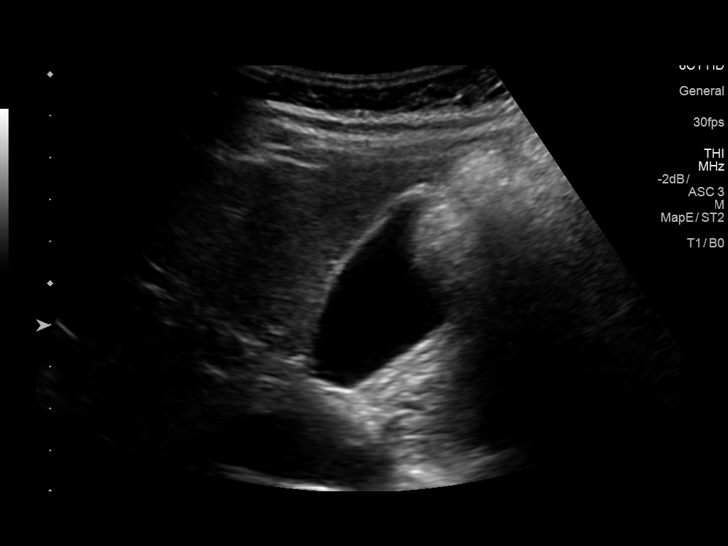
[im 21/83]
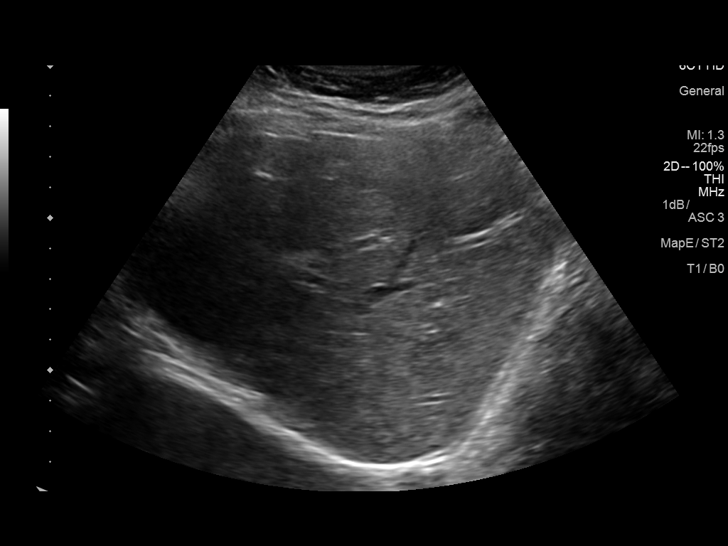
[im 28/83]
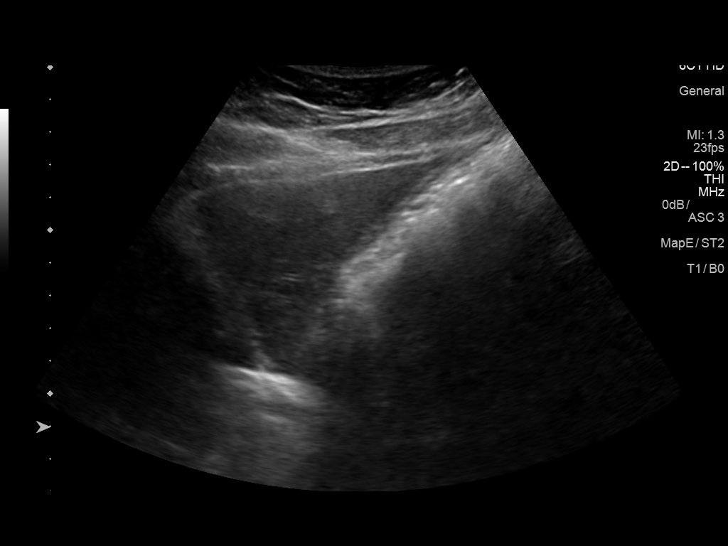
[im 31/83]
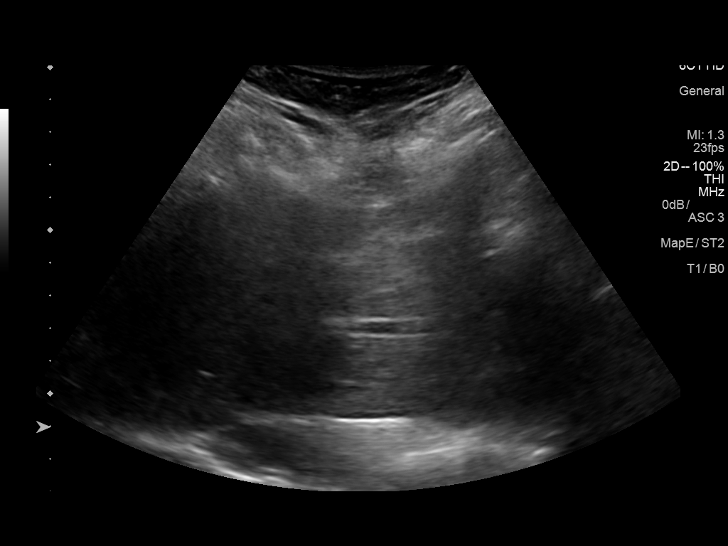
[im 38/83]
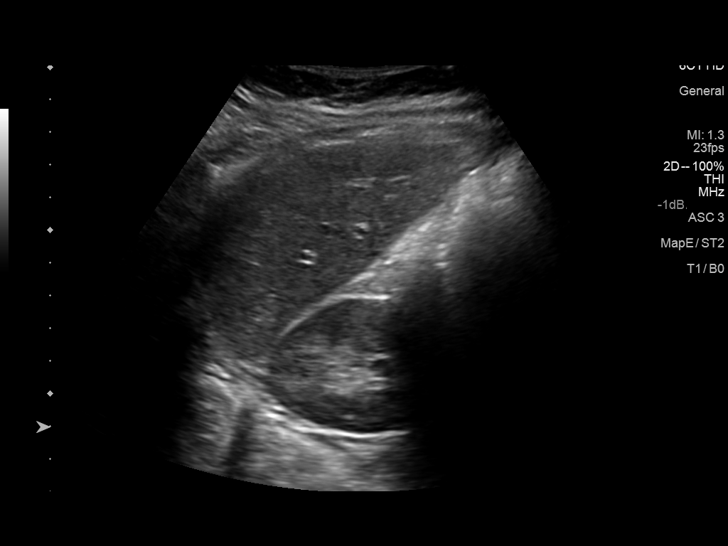
[im 45/83]
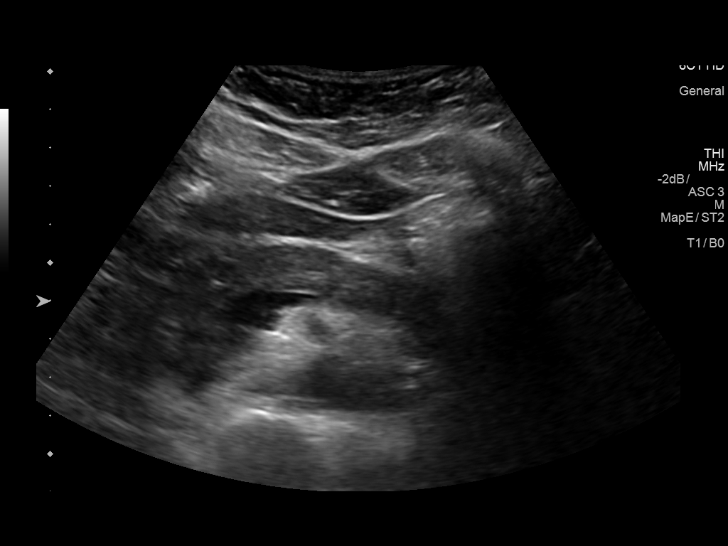
[im 52/83]
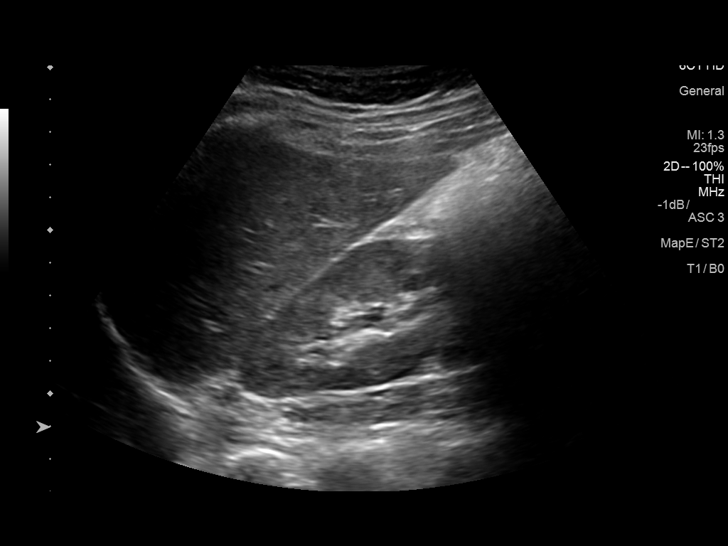
[im 55/83]
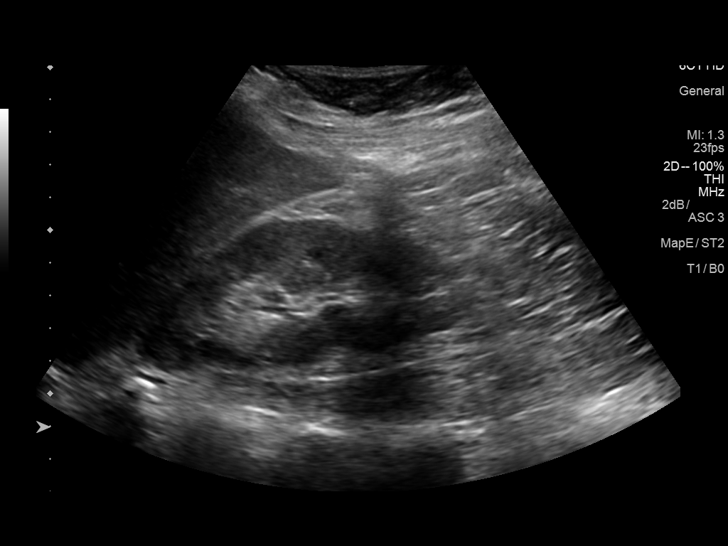
[im 62/83]
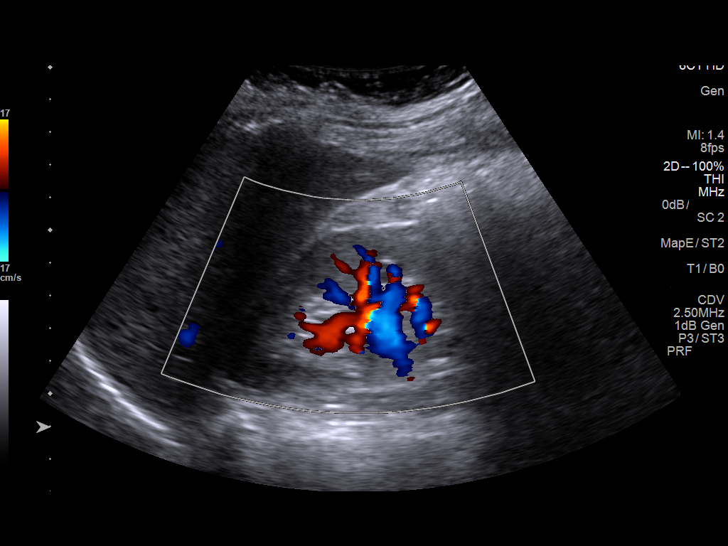
[im 69/83]
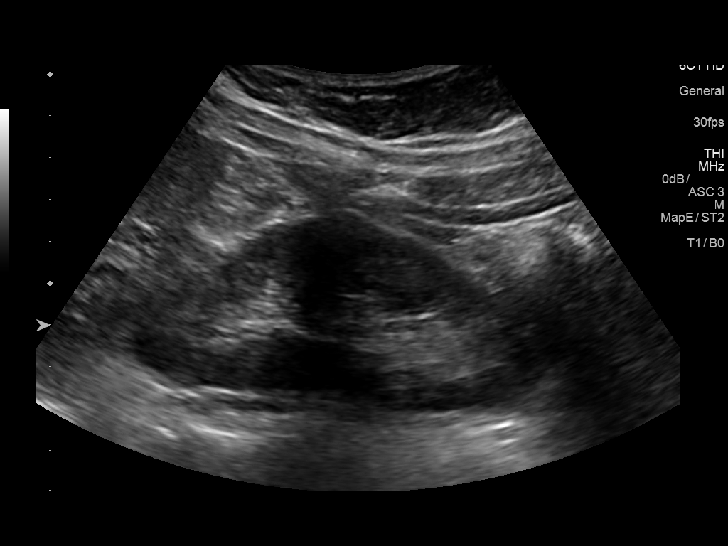
[im 76/83]
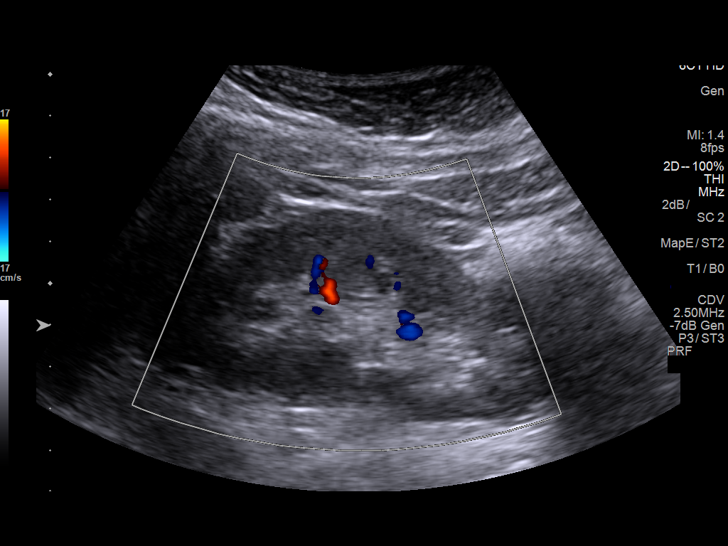
[im 83/83]
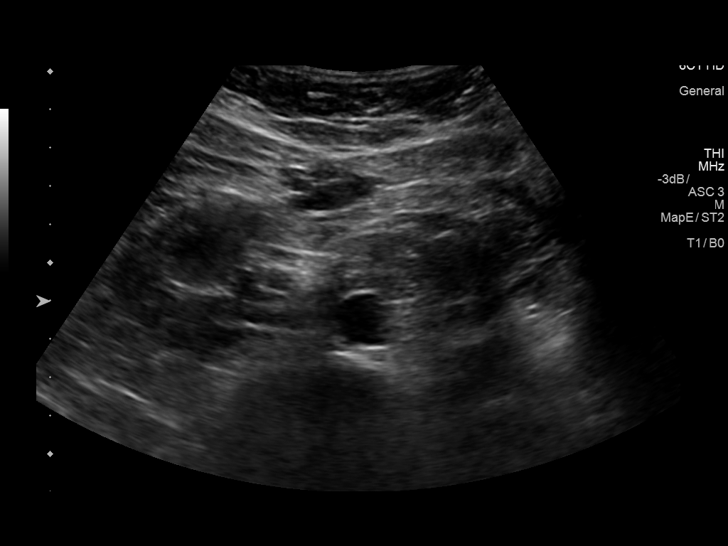

[14 of 25 positions shown; findings below may reference images not displayed]

FINDINGS: Gallbladder: Normally distended without stones or wall thickening.
No pericholecystic fluid or sonographic Murphy sign.

Common bile duct: Diameter: Normal caliber 3 mm diameter

Liver: Normal appearance. Portal vein is patent on color Doppler
imaging with normal direction of blood flow towards the liver.

IVC: Normal appearance

Pancreas: Normal appearance

Spleen: Normal appearance, 3.9 cm length

Right Kidney: Length: 10.7 cm. Normal morphology without mass or
hydronephrosis.

Left Kidney: Length: 10.0 cm. Normal morphology without mass or
hydronephrosis.

Abdominal aorta: Normal caliber

Other findings: No free fluid
IMPRESSION: Normal exam.

## 2021-07-22 ENCOUNTER — Inpatient Hospital Stay (HOSPITAL_COMMUNITY)
Admission: AD | Admit: 2021-07-22 | Discharge: 2021-07-22 | Disposition: A | Discharge status: Home / Self Care | Payer: BC Managed Care – PPO | Attending: Obstetrics and Gynecology | Admitting: Obstetrics and Gynecology

## 2021-07-22 ENCOUNTER — Other Ambulatory Visit: Payer: Self-pay

## 2021-07-22 ENCOUNTER — Encounter (HOSPITAL_COMMUNITY): Payer: Self-pay | Admitting: Obstetrics and Gynecology

## 2021-07-22 DIAGNOSIS — O99011 Anemia complicating pregnancy, first trimester: Secondary | ICD-10-CM | POA: Diagnosis not present

## 2021-07-22 DIAGNOSIS — Z6791 Unspecified blood type, Rh negative: Secondary | ICD-10-CM | POA: Diagnosis not present

## 2021-07-22 DIAGNOSIS — Z3A11 11 weeks gestation of pregnancy: Secondary | ICD-10-CM

## 2021-07-22 DIAGNOSIS — R0602 Shortness of breath: Secondary | ICD-10-CM | POA: Insufficient documentation

## 2021-07-22 DIAGNOSIS — O26891 Other specified pregnancy related conditions, first trimester: Secondary | ICD-10-CM | POA: Diagnosis not present

## 2021-07-22 HISTORY — DX: Other specified health status: Z78.9

## 2021-07-22 LAB — CBC
HCT: 33.3 % — ABNORMAL LOW (ref 36.0–46.0)
Hemoglobin: 10.7 g/dL — ABNORMAL LOW (ref 12.0–15.0)
MCH: 29.2 pg (ref 26.0–34.0)
MCHC: 32.1 g/dL (ref 30.0–36.0)
MCV: 90.7 fL (ref 80.0–100.0)
Platelets: 254 10*3/uL (ref 150–400)
RBC: 3.67 MIL/uL — ABNORMAL LOW (ref 3.87–5.11)
RDW: 13.2 % (ref 11.5–15.5)
WBC: 6.5 10*3/uL (ref 4.0–10.5)
nRBC: 0 % (ref 0.0–0.2)

## 2021-07-22 LAB — URINALYSIS, ROUTINE W REFLEX MICROSCOPIC
Bilirubin Urine: NEGATIVE
Glucose, UA: NEGATIVE mg/dL
Hgb urine dipstick: NEGATIVE
Ketones, ur: NEGATIVE mg/dL
Leukocytes,Ua: NEGATIVE
Nitrite: NEGATIVE
Protein, ur: NEGATIVE mg/dL
Specific Gravity, Urine: 1.025 (ref 1.005–1.030)
pH: 5 (ref 5.0–8.0)

## 2021-07-22 LAB — WET PREP, GENITAL
Clue Cells Wet Prep HPF POC: NONE SEEN
Sperm: NONE SEEN
Trich, Wet Prep: NONE SEEN
WBC, Wet Prep HPF POC: <10 (ref <10)
Yeast Wet Prep HPF POC: NONE SEEN

## 2021-07-22 MED ORDER — RHO D IMMUNE GLOBULIN 1500 UNIT/2ML IJ SOSY
300.0000 ug | PREFILLED_SYRINGE | Freq: Once | INTRAMUSCULAR | Status: AC
  Administered 2021-07-22: 300 ug via INTRAMUSCULAR
  Filled 2021-07-22: qty 2

## 2021-07-22 NOTE — MAU Note (Signed)
Anita Lyons is a 36 y.o. at [redacted]w[redacted]d here in MAU reporting: she's had dizziness and SOB since Sunday.  Reports thought SOB & dizziness was due to having blood work drawn on Wednesday her OB appt.  States SOB & dizziness has continued but today she woke up with wiping, states blood is brownish/pinkish.  Denies bright VB. Reports pelvic exam on Thursday @ OB appt.   ? ?Onset of complaint: Sunday & today ?Pain score: 0 ?Vitals:  ? 07/22/21 1205  ?BP: 126/73  ?Pulse: 95  ?Resp: 18  ?Temp: 98 ?F (36.7 ?C)  ?SpO2: 100%  ?   ?FHT:Not heard ?Lab orders placed from triage:   UA ?

## 2021-07-22 NOTE — MAU Provider Note (Signed)
?History  ?  ? ?CSN: [id number] ? ?Arrival date and time: 07/22/21 1147 ? ? Event Date/Time  ? First Provider Initiated Contact with Patient 07/22/21 1237   ?  ? ?Chief Complaint  ?Patient presents with  ? Shortness of Breath  ? Dizziness  ? Spotting  ? ?36 year old G3 P1-0-1-1 at 11.5 weeks presenting with spotting, shortness of breath, dizziness.  Reports onset of brown/pink spotting this a.m. when she wipes only.  No recent intercourse.  Denies vaginal itching or malodor.  Reports intermittent shortness of breath since last week, mostly with standing and accompanied with dizziness.  No syncope.  No chest pain, leg pain, or edema.  Reports adequate hydration and nutrition.  Reports normal ultrasound in the office last week.  Also reports awaiting urine culture results from the office, was told she might have UTI.  Not currently having urinary symptoms. ? ?OB History   ? ? Gravida  ?3  ? Para  ?1  ? Term  ?1  ? Preterm  ?   ? AB  ?1  ? Living  ?1  ?  ? ? SAB  ?0  ? IAB  ?   ? Ectopic  ?   ? Multiple  ?   ? Live Births  ?1  ?   ?  ?  ? ? ?Past Medical History:  ?Diagnosis Date  ? Medical history non-contributory   ? No pertinent past medical history   ? ? ?Past Surgical History:  ?Procedure Laterality Date  ? CERVICAL POLYPECTOMY    ? DILATION AND CURETTAGE OF UTERUS    ? ? ?Family History  ?Problem Relation Age of Onset  ? Other Neg Hx   ? ? ?Social History  ? ?Tobacco Use  ? Smoking status: Former  ?Substance Use Topics  ? Alcohol use: No  ? Drug use: No  ? ? ?Allergies: No Known Allergies ? ?Medications Prior to Admission  ?Medication Sig Dispense Refill Last Dose  ? doxylamine, Sleep, (UNISOM) 25 MG tablet Take 25 mg by mouth at bedtime as needed.   07/21/2021  ? Prenatal Vit-Fe Fumarate-FA (MULTIVITAMIN-PRENATAL) 27-0.8 MG TABS tablet Take 1 tablet by mouth daily at 12 noon.   07/22/2021  ? pyridoxine (B-6) 100 MG tablet Take 100 mg by mouth daily.   07/21/2021  ? ibuprofen (ADVIL,MOTRIN) 600 MG tablet Take 1  tablet (600 mg total) by mouth every 6 (six) hours. 30 tablet 1   ? oxyCODONE-acetaminophen (PERCOCET/ROXICET) 5-325 MG per tablet Take 1-2 tablets by mouth every 4 (four) hours as needed (moderate - severe pain). (Patient not taking: Reported on 11/11/2017) 30 tablet 0   ? ? ?Review of Systems  ?Respiratory:  Positive for shortness of breath. Negative for cough.   ?Cardiovascular:  Negative for chest pain and leg swelling.  ?Gastrointestinal:  Positive for nausea. Negative for abdominal pain and vomiting.  ?Genitourinary:  Positive for vaginal bleeding. Negative for dysuria, hematuria and vaginal discharge.  ?Neurological:  Positive for dizziness. Negative for syncope.  ?Physical Exam  ? ?Blood pressure 126/73, pulse 95, temperature 98 ?F (36.7 ?C), temperature source Oral, resp. rate 18, height 5\' 4"  (1.626 m), weight 77.7 kg, SpO2 100 %, unknown if currently breastfeeding. ? ?Physical Exam ?Vitals and nursing note reviewed.  ?Constitutional:   ?   General: She is not in acute distress. ?   Appearance: Normal appearance.  ?HENT:  ?   Head: Normocephalic and atraumatic.  ?Cardiovascular:  ?   Rate and Rhythm: Normal  rate and regular rhythm.  ?   Heart sounds: Normal heart sounds.  ?   Comments: Homans neg ?Pulmonary:  ?   Effort: Pulmonary effort is normal. No respiratory distress.  ?   Breath sounds: Normal breath sounds. No stridor. No wheezing, rhonchi or rales.  ?Abdominal:  ?   Palpations: Abdomen is soft.  ?   Tenderness: There is no abdominal tenderness.  ?Musculoskeletal:     ?   General: Normal range of motion.  ?   Cervical back: Normal range of motion.  ?   Right lower leg: No edema.  ?   Left lower leg: No edema.  ?Skin: ?   General: Skin is warm and dry.  ?Neurological:  ?   General: No focal deficit present.  ?   Mental Status: She is alert and oriented to person, place, and time.  ?Psychiatric:     ?   Mood and Affect: Mood normal.     ?   Behavior: Behavior normal.  ? ?Results for orders placed or  performed during the hospital encounter of 07/22/21 (from the past 24 hour(s))  ?Urinalysis, Routine w reflex microscopic Urine, Clean Catch     Status: Abnormal  ? Collection Time: 07/22/21 12:19 PM  ?Result Value Ref Range  ? Color, Urine YELLOW YELLOW  ? APPearance HAZY (A) CLEAR  ? Specific Gravity, Urine 1.025 1.005 - 1.030  ? pH 5.0 5.0 - 8.0  ? Glucose, UA NEGATIVE NEGATIVE mg/dL  ? Hgb urine dipstick NEGATIVE NEGATIVE  ? Bilirubin Urine NEGATIVE NEGATIVE  ? Ketones, ur NEGATIVE NEGATIVE mg/dL  ? Protein, ur NEGATIVE NEGATIVE mg/dL  ? Nitrite NEGATIVE NEGATIVE  ? Leukocytes,Ua NEGATIVE NEGATIVE  ?CBC     Status: Abnormal  ? Collection Time: 07/22/21  1:15 PM  ?Result Value Ref Range  ? WBC 6.5 4.0 - 10.5 K/uL  ? RBC 3.67 (L) 3.87 - 5.11 MIL/uL  ? Hemoglobin 10.7 (L) 12.0 - 15.0 g/dL  ? HCT 33.3 (L) 36.0 - 46.0 %  ? MCV 90.7 80.0 - 100.0 fL  ? MCH 29.2 26.0 - 34.0 pg  ? MCHC 32.1 30.0 - 36.0 g/dL  ? RDW 13.2 11.5 - 15.5 %  ? Platelets 254 150 - 400 K/uL  ? nRBC 0.0 0.0 - 0.2 %  ?Rh IG workup (includes ABO/Rh)     Status: None (Preliminary result)  ? Collection Time: 07/22/21  1:15 PM  ?Result Value Ref Range  ? Gestational Age(Wks) 11   ? ABO/RH(D) O NEG   ? Antibody Screen NEG   ? Unit Number [id number]   ? Blood Component Type RHIG   ? Unit division 00   ? Status of Unit ISSUED   ? Transfusion Status    ?  OK TO TRANSFUSE ?Performed at [facility], [address] [place], [place] [address] ?  ?Wet prep, genital     Status: None  ? Collection Time: 07/22/21  1:26 PM  ? Specimen: Vaginal  ?Result Value Ref Range  ? Yeast Wet Prep HPF POC NONE SEEN NONE SEEN  ? Trich, Wet Prep NONE SEEN NONE SEEN  ? Clue Cells Wet Prep HPF POC NONE SEEN NONE SEEN  ? WBC, Wet Prep HPF POC <10 <10  ? Sperm NONE SEEN   ? ?Limited bedside US: viable, active fetus, +cardiac activity, subj. nml AFV, no SCH ? ?MAU Course  ?Procedures ?RhoGAM ? ?MDM ?Labs ordered and reviewed.  No signs of acute process.  Low suspicion for  PE.  Symptoms likely physiologic to pregnancy.  Mild anemia identified, discussed dietary sources to increase Fe.  Ultrasound confirms viable pregnancy, no SCH seen.  Patient reassured.  Stable for discharge home. ? ?Assessment and Plan  ? ?1. [redacted] weeks gestation of pregnancy   ?2. Rh negative status during pregnancy in first trimester   ?3. Anemia affecting pregnancy in first trimester   ? ?Discharge home ?[facility] ?SAB precautions ? ?Allergies as of 07/22/2021   ?No Known Allergies ?  ? ?  ?Medication List  ?  ? ?STOP taking these medications   ? ?ibuprofen 600 MG tablet ?Commonly known as: ADVIL ?  ?oxyCODONE-acetaminophen 5-325 MG tablet ?Commonly known as: PERCOCET/ROXICET ?  ? ?  ? ?TAKE these medications   ? ?doxylamine (Sleep) 25 MG tablet ?Commonly known as: UNISOM ?Take 25 mg by mouth at bedtime as needed. ?  ?multivitamin-prenatal 27-0.8 MG Tabs tablet ?Take 1 tablet by mouth daily at 12 noon. ?  ?pyridoxine 100 MG tablet ?Commonly known as: B-6 ?Take 100 mg by mouth daily. ?  ? ?  ? ? ?Donette LarryMelanie Vaness Jelinski, CNM ?07/22/2021, 2:59 PM  ?

## 2021-07-23 LAB — GC/CHLAMYDIA PROBE AMP (~~LOC~~) NOT AT ARMC
Chlamydia: NEGATIVE
Comment: NEGATIVE
Comment: NORMAL
Neisseria Gonorrhea: NEGATIVE

## 2021-07-23 LAB — RH IG WORKUP (INCLUDES ABO/RH)
ABO/RH(D): O NEG
Antibody Screen: NEGATIVE
Gestational Age(Wks): 11
Unit division: 0

## 2021-09-23 ENCOUNTER — Other Ambulatory Visit (HOSPITAL_COMMUNITY): Payer: Self-pay | Admitting: *Deleted

## 2021-09-24 ENCOUNTER — Encounter (HOSPITAL_COMMUNITY): Payer: BC Managed Care – PPO

## 2021-09-29 ENCOUNTER — Inpatient Hospital Stay (HOSPITAL_COMMUNITY): Admission: RE | Admit: 2021-09-29 | Payer: BC Managed Care – PPO | Source: Ambulatory Visit

## 2021-09-29 ENCOUNTER — Encounter (HOSPITAL_COMMUNITY): Payer: BC Managed Care – PPO

## 2021-10-02 ENCOUNTER — Encounter (HOSPITAL_COMMUNITY): Payer: BC Managed Care – PPO

## 2021-10-03 ENCOUNTER — Inpatient Hospital Stay (HOSPITAL_COMMUNITY): Admission: RE | Admit: 2021-10-03 | Payer: BC Managed Care – PPO | Source: Ambulatory Visit

## 2021-10-06 ENCOUNTER — Encounter (HOSPITAL_COMMUNITY): Payer: BC Managed Care – PPO

## 2022-02-28 ENCOUNTER — Emergency Department (HOSPITAL_COMMUNITY): Payer: BC Managed Care – PPO

## 2022-02-28 ENCOUNTER — Other Ambulatory Visit: Payer: Self-pay

## 2022-02-28 ENCOUNTER — Encounter (HOSPITAL_COMMUNITY): Payer: Self-pay

## 2022-02-28 ENCOUNTER — Emergency Department (HOSPITAL_COMMUNITY)
Admission: EM | Admit: 2022-02-28 | Discharge: 2022-02-28 | Disposition: A | Discharge status: Home / Self Care | Payer: BC Managed Care – PPO | Attending: Emergency Medicine | Admitting: Emergency Medicine

## 2022-02-28 DIAGNOSIS — M7989 Other specified soft tissue disorders: Secondary | ICD-10-CM | POA: Insufficient documentation

## 2022-02-28 DIAGNOSIS — R110 Nausea: Secondary | ICD-10-CM | POA: Diagnosis not present

## 2022-02-28 DIAGNOSIS — R102 Pelvic and perineal pain: Secondary | ICD-10-CM

## 2022-02-28 DIAGNOSIS — R609 Edema, unspecified: Secondary | ICD-10-CM

## 2022-02-28 DIAGNOSIS — R1030 Lower abdominal pain, unspecified: Secondary | ICD-10-CM | POA: Diagnosis present

## 2022-02-28 DIAGNOSIS — N898 Other specified noninflammatory disorders of vagina: Secondary | ICD-10-CM | POA: Diagnosis not present

## 2022-02-28 LAB — URINALYSIS, ROUTINE W REFLEX MICROSCOPIC
Bilirubin Urine: NEGATIVE
Glucose, UA: NEGATIVE mg/dL
Hgb urine dipstick: NEGATIVE
Ketones, ur: NEGATIVE mg/dL
Nitrite: NEGATIVE
Protein, ur: NEGATIVE mg/dL
Specific Gravity, Urine: 1.024 (ref 1.005–1.030)
pH: 6 (ref 5.0–8.0)

## 2022-02-28 LAB — COMPREHENSIVE METABOLIC PANEL
ALT: 13 U/L (ref 0–44)
AST: 17 U/L (ref 15–41)
Albumin: 3.9 g/dL (ref 3.5–5.0)
Alkaline Phosphatase: 51 U/L (ref 38–126)
Anion gap: 9 (ref 5–15)
BUN: 8 mg/dL (ref 6–20)
CO2: 24 mmol/L (ref 22–32)
Calcium: 9.2 mg/dL (ref 8.9–10.3)
Chloride: 105 mmol/L (ref 98–111)
Creatinine, Ser: 0.87 mg/dL (ref 0.44–1.00)
GFR, Estimated: >60 mL/min (ref >60)
Glucose, Bld: 96 mg/dL (ref 70–99)
Potassium: 3.7 mmol/L (ref 3.5–5.1)
Sodium: 138 mmol/L (ref 135–145)
Total Bilirubin: 0.5 mg/dL (ref 0.3–1.2)
Total Protein: 6.9 g/dL (ref 6.5–8.1)

## 2022-02-28 LAB — CBC
HCT: 37.3 % (ref 36.0–46.0)
Hemoglobin: 12.2 g/dL (ref 12.0–15.0)
MCH: 29.2 pg (ref 26.0–34.0)
MCHC: 32.7 g/dL (ref 30.0–36.0)
MCV: 89.2 fL (ref 80.0–100.0)
Platelets: 278 10*3/uL (ref 150–400)
RBC: 4.18 MIL/uL (ref 3.87–5.11)
RDW: 13.3 % (ref 11.5–15.5)
WBC: 4.7 10*3/uL (ref 4.0–10.5)
nRBC: 0 % (ref 0.0–0.2)

## 2022-02-28 LAB — WET PREP, GENITAL
Clue Cells Wet Prep HPF POC: NONE SEEN
Sperm: NONE SEEN
Trich, Wet Prep: NONE SEEN
WBC, Wet Prep HPF POC: >=10 — AB (ref <10)
Yeast Wet Prep HPF POC: NONE SEEN

## 2022-02-28 LAB — I-STAT BETA HCG BLOOD, ED (MC, WL, AP ONLY): I-stat hCG, quantitative: <5 m[IU]/mL (ref <5)

## 2022-02-28 LAB — LIPASE, BLOOD: Lipase: 27 U/L (ref 11–51)

## 2022-02-28 NOTE — ED Triage Notes (Signed)
Patient reports d&E in June and has had random swelling to hands and feet with mild abdominal pain and cramping. Denies dysuria. Also complains of noting frothy looking urine

## 2022-02-28 NOTE — Discharge Instructions (Signed)
1 out of the 3 blood pressures we took today were elevated.  Elevated blood pressures can cause swelling in the legs and arms.  Please decrease your salt intake and recheck with your primary care physician in the next 1 to 2 weeks.  Urine analysis demonstrated no urinary tract infection. Wet mount demonstrated no yeast infection.  No bacterial vaginosis. You were tested for things such as gonorrhea and chlamydia however the tests take 24 to 48 hours for results.  We will call you for any positive results. Pelvic ultrasound demonstrated ovarian cyst only.  No retained products of conception.  Please follow with your primary care physician if you continue to have suprapubic abdominal pain.

## 2022-02-28 NOTE — ED Provider Notes (Signed)
[facility] EMERGENCY DEPARTMENT Provider Note   CSN: [id number] Arrival date & time: 02/28/22  [id number]     History  No chief complaint on file.   Anita Lyons is a 36 y.o. female.  Pt is a 36 yo female presenting for mild, intermittent, lower abdominal cramping with intermittent nausea x 2-3 weeks. No vomiting, diarrhea, or fevers. Hx of D&C in June 2023 at [redacted] weeks gestation. Denies dysuria or hematuria but admits to frothy urine. Denies vaginal discharge, vaginal pain, vaginal lesions, or pain with sexual intercourse. Pt also admits to intermittent upper and lower bilateral extremity swelling since procedure.   The history is provided by the patient. No language interpreter was used.       Home Medications Prior to Admission medications   Medication Sig Start Date End Date Taking? Authorizing Provider  doxylamine, Sleep, (UNISOM) 25 MG tablet Take 25 mg by mouth at bedtime as needed.    [provider]  Prenatal Vit-Fe Fumarate-FA (MULTIVITAMIN-PRENATAL) 27-0.8 MG TABS tablet Take 1 tablet by mouth daily at 12 noon.    [provider]  pyridoxine (B-6) 100 MG tablet Take 100 mg by mouth daily.    [provider]      Allergies    Patient has no known allergies.    Review of Systems   Review of Systems  Constitutional:  Negative for chills and fever.  HENT:  Negative for ear pain and sore throat.   Eyes:  Negative for pain and visual disturbance.  Respiratory:  Negative for cough and shortness of breath.   Cardiovascular:  Positive for leg swelling. Negative for chest pain and palpitations.  Gastrointestinal:  Positive for abdominal pain. Negative for vomiting.  Genitourinary:  Negative for dysuria and hematuria.  Musculoskeletal:  Negative for arthralgias and back pain.  Skin:  Negative for color change and rash.  Neurological:  Negative for seizures and syncope.  All other systems reviewed and are negative.   Physical  Exam Updated Vital Signs BP (!) 130/90 (BP Location: Right Arm)   Pulse 86   Temp 98.5 F (36.9 C) (Oral)   Resp 13   SpO2 100%  Physical Exam Vitals and nursing note reviewed. Exam conducted with a chaperone present.  Constitutional:      General: She is not in acute distress.    Appearance: She is well-developed.  HENT:     Head: Normocephalic and atraumatic.  Eyes:     Conjunctiva/sclera: Conjunctivae normal.  Cardiovascular:     Rate and Rhythm: Normal rate and regular rhythm.     Heart sounds: No murmur heard. Pulmonary:     Effort: Pulmonary effort is normal. No respiratory distress.     Breath sounds: Normal breath sounds.  Abdominal:     Palpations: Abdomen is soft.     Tenderness: There is no abdominal tenderness.  Genitourinary:    Vagina: No foreign body. Vaginal discharge present. No erythema, tenderness, bleeding or lesions.     Cervix: Normal.  Musculoskeletal:        General: No swelling.     Cervical back: Neck supple.  Skin:    General: Skin is warm and dry.     Capillary Refill: Capillary refill takes less than 2 seconds.  Neurological:     Mental Status: She is alert.  Psychiatric:        Mood and Affect: Mood normal.     ED Results / Procedures / Treatments   Labs (all  labs ordered are listed, but only abnormal results are displayed) Labs Reviewed  WET PREP, GENITAL - Abnormal; Notable for the following components:      Result Value   WBC, Wet Prep HPF POC >=10 (*)    All other components within normal limits  URINALYSIS, ROUTINE W REFLEX MICROSCOPIC - Abnormal; Notable for the following components:   APPearance HAZY (*)    Leukocytes,Ua TRACE (*)    Bacteria, UA RARE (*)    All other components within normal limits  URINE CULTURE  LIPASE, BLOOD  COMPREHENSIVE METABOLIC PANEL  CBC  I-STAT BETA HCG BLOOD, ED ([facility], [facility], [facility] ONLY)  GC/CHLAMYDIA PROBE AMP ([place]) NOT AT [facility]    EKG None  Radiology US PELVIC COMPLETE WITH  TRANSVAGINAL  Result Date: 02/28/2022 CLINICAL DATA:  [id number] Suprapubic abdominal pain [id number] EXAM: TRANSABDOMINAL AND TRANSVAGINAL ULTRASOUND OF PELVIS TECHNIQUE: Both transabdominal and transvaginal ultrasound examinations of the pelvis were performed. Transabdominal technique was performed for global imaging of the pelvis including uterus, ovaries, adnexal regions, and pelvic cul-de-sac. It was necessary to proceed with endovaginal exam following the transabdominal exam to visualize the endometrium, ovaries and adnexa. COMPARISON:  Ultrasound dated October 13, 2017 FINDINGS: Uterus Measurements: 9.3 x 4.9 x 5.0 cm = volume: 118 mL. There is a subserosal fibroid within the RIGHT anterior uterus which measures 19 x 11 x 16 mm. There is an additional likely subserosal fibroid in the posterior uterus which measures approximately 11 x 8 x 11 mm Endometrium Thickness: 11 mm.  No focal abnormality visualized. Right ovary Measurements: 2.5 x 1.7 x 1.7 cm = volume: 3.7 mL. Normal appearance/no adnexal mass. Left ovary Measurements: 2.7 x 1.8 x 2.3 cm = volume: 5.9 mL. Normal appearance/no adnexal mass. Incidental note of a LEFT-sided corpus luteal cyst, which could reflect a source of pain (for which no dedicated imaging follow-up is recommended) . Other findings No abnormal free fluid. IMPRESSION: 1. No evidence of acute process within the pelvis. 2. Incidental note of a likely LEFT-sided corpus luteal cyst, which could reflect a source of pain Electronically Signed   By: Meda Klinefelter M.D.   On: 02/28/2022 12:58    Procedures Procedures    Medications Ordered in ED Medications - No data to display  ED Course/ Medical Decision Making/ A&P                           Medical Decision Making Amount and/or Complexity of Data Reviewed Labs: ordered. Radiology: ordered.   35 yo female presenting for mild, intermittent, lower abdominal cramping with intermittent nausea x 2-3 weeks frothy appearing urine.   Differential diagnosis is includes but is not limited to urinary tract infection, STI, vaginal yeast infection, bacterial vaginosis, abscess or infection from [facility] in June of this year.  Patient is alert and oriented x3, no acute distress, afebrile, so vital signs.  Abdomen is soft with no tenderness on exam.  Laboratory studies stable including stable liver profile, lipase, and renal function.  No leukocytosis.  No signs or symptoms of sepsis.  No signs or symptoms of bowel perforation or obstruction.  Urine analysis demonstrates no urinary tract infection.  Culture sent.  Vaginal exam demonstrates thick white discharge.  Wet mount is negative. Pelvic ultrasound demonstrates small ovarian cyst only.  No uterine abscesses.  No fibroids. No retained products.   Also presents with complaints of swelling of hands and feet.  Originally patient's blood pressure is  stable however after waiting the emergency department for several hours it is slowly climbing and is currently 146/91.  Possibility that patient's upper and lower extremity swelling is secondary to hypertension at home.  Patient is recommended for a low-sodium diet and recheck with primary care physician in the next 1-2 weeks.   Patient in no distress and overall condition improved here in the ED. Detailed discussions were had with the patient regarding current findings, and need for close f/u with PCP or on call doctor. The patient has been instructed to return immediately if the symptoms worsen in any way for re-evaluation. Patient verbalized understanding and is in agreement with current care plan. All questions answered prior to discharge.          Final Clinical Impression(s) / ED Diagnoses Final diagnoses:  Suprapubic abdominal pain  Swelling-arms and legs    Rx / DC Orders ED Discharge Orders     None         Lianne Cure, DO [id number]

## 2022-03-02 LAB — URINE CULTURE: Culture: >=100000 — AB

## 2022-03-02 LAB — GC/CHLAMYDIA PROBE AMP (~~LOC~~) NOT AT ARMC
Chlamydia: NEGATIVE
Comment: NEGATIVE
Comment: NORMAL
Neisseria Gonorrhea: NEGATIVE

## 2022-03-03 ENCOUNTER — Telehealth (HOSPITAL_BASED_OUTPATIENT_CLINIC_OR_DEPARTMENT_OTHER): Payer: Self-pay

## 2022-03-03 NOTE — Telephone Encounter (Signed)
Post ED Visit - Positive Culture Follow-up  Culture report reviewed by antimicrobial stewardship pharmacist: [facility] [x]  , Pharm.D. []  Daylene Posey, Pharm.D., BCPS AQ-ID []  , Pharm.D., BCPS []  Celedonio Miyamoto, .D., BCPS []  [place], .D., BCPS, AAHIVP []  Georgina Pillion, Pharm.D., BCPS, AAHIVP []  [address], PharmD, BCPS []  , PharmD, BCPS []  [place], PharmD, BCPS []  [place], PharmD []  , PharmD, BCPS []  Estella Husk, PharmD  Pharmacy Team []  Lysle Pearl, PharmD []  , PharmD []  Phillips Climes, PharmD []  , Rph []  Agapito Games) , PharmD []  Verlan Friends, PharmD []  , PharmD []  Mervyn Gay, PharmD []  , PharmD []  Vinnie Level, PharmD []  [facility], PharmD []  , PharmD []  Len Childs, PharmD   Positive urine culture Reviewed by ED provider , PA No urinary sx, UA neg. Not treated and no treatment needed, no further patient follow-up is required at this time.  Greer Pickerel 03/03/2022, 12:05 PM
